# Patient Record
Sex: Female | Born: 1955 | Race: White | Hispanic: No | Marital: Married | Smoking: Never smoker
Health system: Southern US, Community
[De-identification: ages and names within clinical notes are randomized; demographics above are authoritative.]

## PROBLEM LIST (undated history)

## (undated) DIAGNOSIS — R55 Syncope and collapse: Secondary | ICD-10-CM

## (undated) DIAGNOSIS — G43909 Migraine, unspecified, not intractable, without status migrainosus: Secondary | ICD-10-CM

## (undated) DIAGNOSIS — R112 Nausea with vomiting, unspecified: Secondary | ICD-10-CM

## (undated) DIAGNOSIS — F329 Major depressive disorder, single episode, unspecified: Secondary | ICD-10-CM

## (undated) DIAGNOSIS — C4490 Unspecified malignant neoplasm of skin, unspecified: Secondary | ICD-10-CM

## (undated) DIAGNOSIS — Z9889 Other specified postprocedural states: Secondary | ICD-10-CM

## (undated) DIAGNOSIS — F32A Depression, unspecified: Secondary | ICD-10-CM

## (undated) DIAGNOSIS — E785 Hyperlipidemia, unspecified: Secondary | ICD-10-CM

## (undated) DIAGNOSIS — Z780 Asymptomatic menopausal state: Secondary | ICD-10-CM

## (undated) HISTORY — PX: APPENDECTOMY: SHX54

## (undated) HISTORY — DX: Hyperlipidemia, unspecified: E78.5

## (undated) HISTORY — DX: Depression, unspecified: F32.A

## (undated) HISTORY — DX: Asymptomatic menopausal state: Z78.0

## (undated) HISTORY — DX: Syncope and collapse: R55

## (undated) HISTORY — DX: Major depressive disorder, single episode, unspecified: F32.9

---

## 2005-12-01 ENCOUNTER — Emergency Department (HOSPITAL_COMMUNITY): Admission: EM | Admit: 2005-12-01 | Discharge: 2005-12-01 | Payer: Self-pay | Admitting: Emergency Medicine

## 2006-01-26 ENCOUNTER — Emergency Department (HOSPITAL_COMMUNITY): Admission: EM | Admit: 2006-01-26 | Discharge: 2006-01-26 | Payer: Self-pay | Admitting: Emergency Medicine

## 2007-06-09 ENCOUNTER — Emergency Department (HOSPITAL_COMMUNITY): Admission: EM | Admit: 2007-06-09 | Discharge: 2007-06-09 | Payer: Self-pay | Admitting: Emergency Medicine

## 2008-01-23 ENCOUNTER — Encounter: Payer: Self-pay | Admitting: Gastroenterology

## 2008-02-09 ENCOUNTER — Ambulatory Visit: Payer: Self-pay | Admitting: Gastroenterology

## 2008-02-24 ENCOUNTER — Ambulatory Visit: Payer: Self-pay | Admitting: Internal Medicine

## 2010-07-10 DIAGNOSIS — R55 Syncope and collapse: Secondary | ICD-10-CM

## 2010-07-10 HISTORY — DX: Syncope and collapse: R55

## 2010-07-14 ENCOUNTER — Emergency Department (HOSPITAL_COMMUNITY): Payer: BC Managed Care – PPO

## 2010-07-14 ENCOUNTER — Observation Stay (HOSPITAL_COMMUNITY)
Admission: EM | Admit: 2010-07-14 | Discharge: 2010-07-16 | Disposition: A | Discharge status: Home / Self Care | Payer: BC Managed Care – PPO | Attending: Internal Medicine | Admitting: Internal Medicine

## 2010-07-14 DIAGNOSIS — Z01818 Encounter for other preprocedural examination: Secondary | ICD-10-CM | POA: Insufficient documentation

## 2010-07-14 DIAGNOSIS — R55 Syncope and collapse: Secondary | ICD-10-CM | POA: Insufficient documentation

## 2010-07-14 DIAGNOSIS — Z01810 Encounter for preprocedural cardiovascular examination: Secondary | ICD-10-CM | POA: Insufficient documentation

## 2010-07-14 DIAGNOSIS — F329 Major depressive disorder, single episode, unspecified: Secondary | ICD-10-CM | POA: Insufficient documentation

## 2010-07-14 DIAGNOSIS — E785 Hyperlipidemia, unspecified: Secondary | ICD-10-CM | POA: Insufficient documentation

## 2010-07-14 DIAGNOSIS — Z01812 Encounter for preprocedural laboratory examination: Secondary | ICD-10-CM | POA: Insufficient documentation

## 2010-07-14 DIAGNOSIS — R079 Chest pain, unspecified: Secondary | ICD-10-CM

## 2010-07-14 DIAGNOSIS — Z8249 Family history of ischemic heart disease and other diseases of the circulatory system: Secondary | ICD-10-CM | POA: Insufficient documentation

## 2010-07-14 DIAGNOSIS — F3289 Other specified depressive episodes: Secondary | ICD-10-CM | POA: Insufficient documentation

## 2010-07-14 DIAGNOSIS — R51 Headache: Secondary | ICD-10-CM | POA: Insufficient documentation

## 2010-07-14 LAB — DIFFERENTIAL
Basophils Relative: 0 % (ref 0–1)
Eosinophils Relative: 12 % — ABNORMAL HIGH (ref 0–5)
Lymphocytes Relative: 21 % (ref 12–46)
Lymphs Abs: 1.3 10*3/uL (ref 0.7–4.0)
Monocytes Relative: 7 % (ref 3–12)
Neutro Abs: 3.7 10*3/uL (ref 1.7–7.7)
Neutrophils Relative %: 59 % (ref 43–77)

## 2010-07-14 LAB — POCT CARDIAC MARKERS: Myoglobin, poc: 65 ng/mL (ref 12–200)

## 2010-07-14 LAB — COMPREHENSIVE METABOLIC PANEL
ALT: 21 U/L (ref 0–35)
Albumin: 3.6 g/dL (ref 3.5–5.2)
Alkaline Phosphatase: 46 U/L (ref 39–117)
BUN: 12 mg/dL (ref 6–23)
CO2: 28 mEq/L (ref 19–32)
Chloride: 104 mEq/L (ref 96–112)
Creatinine, Ser: 0.73 mg/dL (ref 0.4–1.2)
GFR calc Af Amer: >60 mL/min (ref >60)
GFR calc non Af Amer: >60 mL/min (ref >60)
Glucose, Bld: 86 mg/dL (ref 70–99)
Sodium: 139 mEq/L (ref 135–145)
Total Protein: 6.6 g/dL (ref 6.0–8.3)

## 2010-07-14 LAB — CBC
HCT: 38.1 % (ref 36.0–46.0)
MCHC: 34.1 g/dL (ref 30.0–36.0)
Platelets: 206 10*3/uL (ref 150–400)
RBC: 3.96 MIL/uL (ref 3.87–5.11)

## 2010-07-14 MED ORDER — IOHEXOL 350 MG/ML SOLN
100.0000 mL | Freq: Once | INTRAVENOUS | Status: AC | PRN
  Administered 2010-07-14: 100 mL via INTRAVENOUS

## 2010-07-15 DIAGNOSIS — R55 Syncope and collapse: Secondary | ICD-10-CM

## 2010-07-15 LAB — CARDIAC PANEL(CRET KIN+CKTOT+MB+TROPI)
CK, MB: 1.5 ng/mL (ref 0.3–4.0)
CK, MB: 1.7 ng/mL (ref 0.3–4.0)
Relative Index: INVALID (ref 0.0–2.5)
Relative Index: INVALID (ref 0.0–2.5)
Relative Index: INVALID (ref 0.0–2.5)
Total CK: 89 U/L (ref 7–177)
Troponin I: <0.01 ng/mL (ref 0.00–0.06)
Troponin I: 0.01 ng/mL (ref 0.00–0.06)

## 2010-07-15 LAB — BASIC METABOLIC PANEL
CO2: 28 mEq/L (ref 19–32)
Creatinine, Ser: 0.75 mg/dL (ref 0.4–1.2)
Glucose, Bld: 85 mg/dL (ref 70–99)
Sodium: 141 mEq/L (ref 135–145)

## 2010-07-15 LAB — HEMOGLOBIN A1C: Mean Plasma Glucose: 100 mg/dL (ref <117)

## 2010-07-15 LAB — LIPID PANEL
Cholesterol: 231 mg/dL — ABNORMAL HIGH (ref 0–200)
LDL Cholesterol: 153 mg/dL — ABNORMAL HIGH (ref 0–99)
Total CHOL/HDL Ratio: 3.8 RATIO
VLDL: 17 mg/dL (ref 0–40)

## 2010-07-15 LAB — CBC
HCT: 35.9 % — ABNORMAL LOW (ref 36.0–46.0)
MCV: 95.7 fL (ref 78.0–100.0)
RDW: 12.2 % (ref 11.5–15.5)

## 2010-07-16 ENCOUNTER — Emergency Department (HOSPITAL_BASED_OUTPATIENT_CLINIC_OR_DEPARTMENT_OTHER)
Admission: EM | Admit: 2010-07-16 | Discharge: 2010-07-17 | Disposition: A | Discharge status: Home / Self Care | Payer: BC Managed Care – PPO | Attending: Emergency Medicine | Admitting: Emergency Medicine

## 2010-07-16 DIAGNOSIS — R072 Precordial pain: Secondary | ICD-10-CM

## 2010-07-16 DIAGNOSIS — W292XXA Contact with other powered household machinery, initial encounter: Secondary | ICD-10-CM | POA: Insufficient documentation

## 2010-07-16 DIAGNOSIS — S01501A Unspecified open wound of lip, initial encounter: Secondary | ICD-10-CM | POA: Insufficient documentation

## 2010-07-16 DIAGNOSIS — R079 Chest pain, unspecified: Secondary | ICD-10-CM

## 2010-07-16 LAB — CBC
Hemoglobin: 11.2 g/dL — ABNORMAL LOW (ref 12.0–15.0)
MCH: 33 pg (ref 26.0–34.0)
Platelets: 175 10*3/uL (ref 150–400)
RBC: 3.39 MIL/uL — ABNORMAL LOW (ref 3.87–5.11)
WBC: 5.4 10*3/uL (ref 4.0–10.5)

## 2010-07-16 LAB — BASIC METABOLIC PANEL
BUN: 13 mg/dL (ref 6–23)
CO2: 25 mEq/L (ref 19–32)
Calcium: 7.7 mg/dL — ABNORMAL LOW (ref 8.4–10.5)
GFR calc non Af Amer: >60 mL/min (ref >60)
Glucose, Bld: 83 mg/dL (ref 70–99)

## 2010-07-16 LAB — VITAMIN B12: Vitamin B-12: 361 pg/mL (ref 211–911)

## 2010-07-18 NOTE — Consult Note (Signed)
NAME:  Kelli Richmond, Kelli Richmond              ACCOUNT NO.:  [id number]  MEDICAL RECORD NO.:  [id number]           PATIENT TYPE:  I  LOCATION:  3702                         FACILITY:  [facility]  PHYSICIAN:  Wendi Snipes, MD DATE OF BIRTH:  Sep 13, 1955  DATE OF CONSULTATION:  07/14/2010 DATE OF DISCHARGE:                                CONSULTATION   PRIMARY CARE PHYSICIAN:  Dr. Collins Scotland in [place].  REASON FOR CONSULTATION:  Syncope and  chest pain.  CHIEF COMPLAINT:  Syncope and chest pain.  HISTORY OF PRESENT ILLNESS:  This is a 55 year old white female with a history of depression and a family history of premature coronary artery disease who presents here after passing out while at the physician's office today.  She felt chest pressure prior to losing consciousness for a few seconds.  She denies any palpitations surrounding this event, and states that she has not passed out before and there was no prodrome to her syncopal episode besides chest pressure.  She also endorses chest pressure during the earlier parts of the week at rest and with mild exertion.  She has not received cardiac workup in the past, additionally she complains of episodes of chest pressure while in the hospital since her admission.  She otherwise denies any increased lower extremity edema or paroxysmal nocturnal dyspnea, orthopnea or palpitations.  PAST MEDICAL HISTORY:  Depression.  ALLERGIES:  No known drug allergies.  MEDICATIONS ON ADMISSION:  Cymbalta 90 mg daily, estrogen replacement, Caltrate, and multivitamin.  SOCIAL HISTORY:  She lives in [place] with her husband.  She is unemployed.  She does not smoke.  FAMILY HISTORY:  Her father died of myocardial infarction at 65.  She has twin sister that suffered from myocardial function last year.  REVIEW OF SYSTEMS:  All 14 systems were reviewed were negative except as mentioned detail in HPI.  PHYSICAL EXAMINATION:  VITAL SIGNS:  Blood pressure is  111/71, respiratory rate is 16, pulse 82.  She is satting 96% on room air. GENERAL:  She is a 55 year old white female appearing her stated age in no acute distress. HEENT:  Moist mucous membranes.  Pupils are equal, round, reactive to light and accommodation.  Anicteric sclerae. NECK:  No jugular venous distention.  No thyromegaly. CARDIOVASCULAR:  Regular rate and rhythm.  No murmurs, rubs, or gallops. LUNGS:  Clear to auscultation bilaterally. ABDOMEN:  Nontender and nondistended.  Positive bowel sounds.  No masses. EXTREMITIES:  No clubbing, cyanosis, or edema. NEUROLOGIC:  Alert and x3.  Cranial nerves II through XII grossly intact.  No focal neurologic deficit. SKIN:  Warm, dry, and no rashes. PSYCHIATRIC:  Mood and affect are appropriate.  RADIOLOGY:  Review a CT of her chest, abdomen, and pelvis revealed no acute process.  EKG showed normal sinus rhythm with a rate of 78 beats per minute with no ST or T-wave abnormalities suggestive of ischemia, normal EKG.  Laboratory review white count 6.3, hematocrit is 38, potassium is 4.1, creatinine is 0.73.  INR is 0.9.  Troponin is 0.05.  ASSESSMENT/PLAN:  Assessment is a 54 year old white female with a family history of premature coronary  artery disease here with chest pressure with a brief episode of syncope. 1. Chest pressure is concerning for unstable angina.  However, there     is no current objective evidence of ischemia.  We will recommend     full-dose anticoagulation, cycle cardiac markers and there is no     further evidence of ischemia.  We will suggest noninvasive risk     ratification with an exercise stress test. 2. Syncope is likely vasovagal.  We will monitor telemetry for any     arrhythmias and consider obtaining echocardiogram for structural     heart disease and in the morning.     Wendi Snipes, MD     BHH/MEDQ  D:  07/14/2010  T:  07/15/2010  Job:  [id number]  Electronically Signed by Jim Desanctis MD on 07/18/2010 12:54:54 PM

## 2010-07-18 NOTE — Discharge Summary (Signed)
NAME:  Kelli Richmond, Kelli Richmond              ACCOUNT NO.:  [id number]  MEDICAL RECORD NO.:  [id number]           PATIENT TYPE:  I  LOCATION:  3702                         FACILITY:  [facility]  PHYSICIAN:  Andreas Blower, MD       DATE OF BIRTH:  [birth year]  DATE OF ADMISSION:  07/14/2010 DATE OF DISCHARGE:  07/16/2010                              DISCHARGE SUMMARY   PRIMARY CARE PHYSICIAN:  Dr. Collins Scotland.  CARDIOLOGIST:  [facility].  DISCHARGE DIAGNOSES: 1. Chest pain. 2. Syncope, most likely vasovagal. 3. Status post menopause. 4. Depression. 5. Hyperlipidemia.  DISCHARGE MEDICATIONS: 1. Simvastatin 20 mg daily at bedtime. 2. Aspirin 81 mg p.o. daily. 3. Calcium carbonate 600 mg twice daily. 4. Cymbalta 90 mg p.o. daily. 5. Estradiol 0.5 mg p.o. twice daily. 6. Multivitamins 1 tablet p.o. daily.  BRIEF ADMITTING HISTORY AND PHYSICAL:  Kelli Richmond is a 55 year old Caucasian female with a history of depression who presented on July 14, 2010, with chest pain and syncope.  RADIOLOGY/IMAGING: 1. The patient had portable chest x-ray which shows no acute     cardiopulmonary process. 2. The patient had a head CT without contrast which shows no acute or     significant findings. 3. The patient had CT of the chest, abdomen, and pelvis which shows no     pulmonary embolism, no evidence of thoracic aortic dissection.  No     acute findings within the abdomen or pelvis.  LABORATORY DATA:  CBC shows a white count of 5.4, hemoglobin 11.2, hematocrit 33.1, platelet count 175, D-dimer 0.42.  Electrolytes normal with a creatinine of 0.73.  Liver function tests normal.  Troponins negative x3.  LDL was 153.  TSH was 2.938.  Vitamin B12 is 361.  Serum cortisol is 8.3.  Hemoglobin A1c is 5.1.  PROCEDURES DONE:  The patient had cardiac cath on July 16, 2010, preliminary result showed normal LV function.  No significant obstruction.  PENDING TESTS:  The patient has 2D echocardiogram pending, to  be discharged after the echocardiogram is done.  HOSPITAL COURSE BY PROBLEM: 1. Chest pain.  The patient was admitted and was ruled out for acute     coronary syndrome.  Given the patient's risk factor, Cardiology was     consulted and Cardiology elected to a cardiac cath on July 16, 2010, preliminary result showed normal LV function.  No significant     obstruction. 2. Syncope.  The patient had a head CT, CT angiogram, and a D-dimer     done which were all normal.  No PE was noted and D-dimer was normal     to indicate a low likelihood of a thromboembolic event.  A 2D     echocardiogram is pending.  The patient was instructed that if she     is not called with the results, she is to have her primary care     physician follow up with results. 3. Depression.  Continued the patient on home medications. 4. Hyperlipidemia.  LDL was elevated at 153.  As a result, was started     on low-dose  statin which she will continue at the time of     discharge. 5. Postmenopausal.  Continue the patient on home medications.  DISPOSITION AND FOLLOWUP:  The patient is to follow up with her primary care physician in 1 week.   Addendum: 2D ECHO Study Conclusions   - Left ventricle: The cavity size was normal. Wall thickness was     normal. The estimated ejection fraction was 60%. Wall motion was     normal; there were no regional wall motion abnormalities. Patient was called and notified of the results.  Time spent on discharge talking to the patient, family, and coordinating care was 35 minutes.   Andreas Blower, MD   SR/MEDQ  D:  07/16/2010  T:  07/17/2010  Job:  [id number]  Electronically Signed by Wardell Heath Seann Genther  on 07/17/2010 09:30:10 PM

## 2010-07-22 NOTE — H&P (Signed)
NAME:  Kelli Richmond, Kelli Richmond              ACCOUNT NO.:  [id number]  MEDICAL RECORD NO.:  [id number]           PATIENT TYPE:  I  LOCATION:  3702                         FACILITY:  [facility]  PHYSICIAN:  Erick Blinks, MD     DATE OF BIRTH:  [birth year]  DATE OF ADMISSION:  07/14/2010 DATE OF DISCHARGE:                             HISTORY & PHYSICAL   PRIMARY CARE PHYSICIAN:  Tammy R. Collins Scotland, MD.  CHIEF COMPLAINT:  Chest pain and syncope.  HISTORY OF PRESENT ILLNESS:  This is a 55 year old female that was accompanying her daughter to her daughter's doctor's appointment today when she had a syncopal episode.  The patient reports feeling dizzy prior to passing out.  She also complained of some retrosternal chest pain prior to passing out as well.  She passed out for approximately few seconds without any head trauma. After the episode, she also did complain of some retrosternal chest pain which was radiating to her left arm.  The patient has been having these episodes on and off for the past week or so.  She reports that she is normally active person and works on the gym 3 times a day.  The past week she did not go to gym secondary to she was not feeling well.  She reports that when these pains come on, she often starts to have diaphoresis.  Denies any nausea or vomiting. She describes the pain as a pressure-type pain, episode lasting a few minutes.  Episodes occur at rest as well as on exertion.  When they do occur on exertion, they resolve.  When she rests, these usually resolves spontaneously.  Workup in the emergency room was unrevealing but the patient has been admitted for further evaluation.  PAST MEDICAL HISTORY:  The patient does not have any significant past medical history.  ALLERGIES:  No known drug allergies.  MEDICATIONS PRIOR TO ADMISSION: 1. Cymbalta 90 mg p.o. daily. 2. Aspirin 81 mg daily. 3. Calcium carbonate 600 mg p.o. b.i.d. 4. Multivitamin 1 tablet p.o. daily. 5.  Estrace 0.5 mg p.o. twice daily for menopause. 6. The patient reports she is taking Cymbalta also for menopause.  She     denies any history of depression.  FAMILY HISTORY:  The patient has a twin sister who had an MI history with stents placed.  She has a brother who underwent stress testing today for chest pain.  Her father passed away from an MI in his early 87s and her mother also has significant cardiac history with a pacemaker placed.  SOCIAL HISTORY:  The patient does not smoke, drink, or use any drugs. She is a relatively active person and reports working out approximately 3 times a week before she started having these pains.  REVIEW OF SYSTEMS:  All systems have been reviewed and pertinent positives stated in the HPI.  PHYSICAL EXAMINATION:  VITAL SIGNS:  Temperature 97.3, blood pressure 111/71, heart rate of 82, respirations 14, pulse ox 96% on room air. GENERAL:  The patient is in no acute distress, lying comfortably in bed. HEENT:  Normocephalic, atraumatic.  Pupils are equal, round, and reactive to light.NECK:  Supple. CHEST:  Clear to auscultation bilaterally. CARDIAC:  Shows S1, S2 with a regular rate and rhythm.  There is no palpable chest pain. ABDOMEN:  Soft, nontender.  Bowel sounds are active. EXTREMITIES:  Show no signs of cyanosis, clubbing, or edema. NEUROLOGIC:  The patient has 5/5 strength bilaterally.  There is no facial asymmetry.  Cranial nerves II-XII are grossly intact.  The patient is alert and oriented x3. SKIN:  Warm without any visible lesions.  LABORATORY DATA:  WBC 6.3, hemoglobin 13, platelet count of 206,000. INR 0.94.  Sodium 139, potassium 4.1, chloride 104, bicarb 28, glucose 86, BUN 12, creatinine 0.73.  Liver function tests within normal limits. Calcium 9.1.  Point-of-care cardiac markers are negative.  EKG shows a right bundle branch block but otherwise no acute ST-T changes.  Chest x-ray shows no acute cardiopulmonary process.  CT  head shows no acute or significant findings.  CT angio of the chest, abdomen, and pelvis show no evidence of thoracic aortic dissection.  No saddle embolus identified.  No acute findings within the abdomen or pelvis.  No evidence for aortic dissection.  ASSESSMENT/PLAN: 1. Chest pain with syncope.  Although the patient does not have many     medical problems that would be risk factors for coronary disease,     she does have a very significant family history of premature     coronary disease.  At this time, we will admit her to telemetry     bed, cycle her cardiac enzymes, repeat an EKG in the morning and     check a 2-D echocardiogram. We have asked cardiology consultation     with [facility] as she will likely need a stress test and perhaps more     of as felt by the cardiology service.  We will keep her n.p.o.     after midnight for any possible testing in the morning.  We will     continue her on aspirin for now.  To further risk stratify her, we will check a     fasting lipid panel as well as the hemoglobin A1c.  We will also     check TSH.  We will continue her home medication. 2. Headache.  The patient does have right-sided headache.  She does     have a history of migraines and describes as classically as her     migraine.  We have given her Toradol and can treat with further     medication accordingly.  I do not feel that an MRI of her brain is     necessary at this time as she has no focal deficits.  Her CT of     head has been negative.  Further orders per the clinical course.     Erick Blinks, MD     JM/MEDQ  D:  07/14/2010  T:  07/15/2010  Job:  [id number]  cc:   Tammy R. Collins Scotland, M.D.  Electronically Signed by Erick Blinks  on 07/22/2010 09:57:48 PM

## 2010-08-21 NOTE — Procedures (Signed)
  NAME:  Kelli Richmond, Kelli Richmond              ACCOUNT NO.:  [id number]  MEDICAL RECORD NO.:  [id number]           PATIENT TYPE:  E  LOCATION:  [facility]                         [facility]  PHYSICIAN:  Arturo Morton. Riley Kill, MD, FACCDATE OF BIRTH:  1955-07-06  DATE OF PROCEDURE:  07/16/2010 DATE OF DISCHARGE:  07/17/2010                           CARDIAC CATHETERIZATION   INDICATIONS:  [name] is a 55 year old admitted with a history of syncope.  She was seen in consultation by the [facility] Cardiology Team, and referred for diagnostic cardiac catheterization.  Risks, benefits, and alternatives were explained to the patient and she consented to proceed.  PROCEDURES: 1. Left heart catheterization. 2. Selective coronary arteriography. 3. Selective left ventriculography.  DESCRIPTION OF PROCEDURE:  The procedure was performed from the femoral artery using 4-French catheters.  She tolerated the procedure well without complications and was taken to the holding area in satisfactory clinical condition.  HEMODYNAMIC DATA: 1. Central aortic pressure was 135/82, mean 106. 2. LV pressure 128/13. 3. No gradient or pullback across aortic valve.  ANGIOGRAPHIC DATA: 1. Ventriculography done in the RAO projection revealed well-preserved     left ventricular function.  No wall motion abnormalities were     identified. 2. The left main is short and without significant focal obstruction.     It trifurcates early into a left anterior descending, ramus     intermedius, and native circumflex vessel. 3. The left anterior descending artery courses to the apex.  There are     several diagonal branches, all of which are relatively small.  The     apical vessel bifurcates.  No significant obstruction is noted. 4. The ramus intermedius is relatively small in caliber, bifurcates     distally, and is without significant obstruction. 5. The circumflex is a moderate-sized vessel providing a large     marginal branch  and AV circumflex.  This does not demonstrate     significant focal obstruction. 6. The right coronary artery provides a posterior descending and     posterolateral branch and is normally distributed.  The vessel was     smooth without significant focal obstruction.  CONCLUSION: 1. Well-preserved left ventricular function. 2. No significant coronary obstruction.  DISPOSITION: 1. The patient will be taken back to her room for convalescent care. 2. 2-D echo. 3. D-dimer.     Arturo Morton. Riley Kill, MD, [facility]     TDS/MEDQ  D:  08/11/2010  T:  08/11/2010  Job:  [id number]  cc:   Vesta Mixer, M.D. [facility] Tammy R. Collins Scotland, M.D.  Electronically Signed by Shawnie Pons MD [facility] on 08/21/2010 05:37:18 AM

## 2010-09-23 ENCOUNTER — Encounter: Payer: Self-pay | Admitting: *Deleted

## 2010-09-23 ENCOUNTER — Encounter: Payer: Self-pay | Admitting: Physician Assistant

## 2010-09-24 ENCOUNTER — Encounter: Payer: Self-pay | Admitting: Physician Assistant

## 2010-09-24 ENCOUNTER — Encounter (INDEPENDENT_AMBULATORY_CARE_PROVIDER_SITE_OTHER): Payer: BC Managed Care – PPO

## 2010-09-24 ENCOUNTER — Ambulatory Visit (INDEPENDENT_AMBULATORY_CARE_PROVIDER_SITE_OTHER): Payer: BC Managed Care – PPO | Admitting: Physician Assistant

## 2010-09-24 VITALS — BP 116/78 | HR 95 | Ht 63.0 in | Wt 137.0 lb

## 2010-09-24 DIAGNOSIS — R55 Syncope and collapse: Secondary | ICD-10-CM

## 2010-09-24 DIAGNOSIS — R9431 Abnormal electrocardiogram [ECG] [EKG]: Secondary | ICD-10-CM

## 2010-09-24 NOTE — Patient Instructions (Signed)
Your physician recommends that you schedule a follow-up appointment in: 4 WEEKS WITH DR. Graciela Husbands AS PER DR. KLEIN AND SCOTT WEAVER, PA-C FOR SYNCOPE [phone]  Your physician has recommended that you wear an event monitor THIS NEEDS TO BE PUT ON ASAP AS PER SCOTT WEAVER, PA-C FOR SYNCOPE 780.2. Event monitors are medical devices that record the heart's electrical activity. Doctors most often [place] these monitors to diagnose arrhythmias. Arrhythmias are problems with the speed or rhythm of the heartbeat. The monitor is a small, portable device. You can wear one while you do your normal daily activities. This is usually used to diagnose what is causing palpitations/syncope (passing out).   SCOTT WEAVER, PA-C HAS ASKED FOR YOU NOT TO DRIVE UNTIL FURTHER EVALUATION.

## 2010-09-24 NOTE — Progress Notes (Signed)
History of Present Illness: Kelli Richmond is a 55 y.o. female who was evaluated by our service when [facility] with syncope.  Myocardial infarction was ruled out.  She has a significant family history of CAD and was referred for cardiac catheterization.  This was performed on 3/7 and demonstrated no CAD.  Echocardiogram demonstrated normal LV function with an EF of 60%. Carotid dopplers were negative for ICA stenosis.  Head CT was negative. She presents for followup.  Since discharge from the hospital, she's had 2 other episodes of syncope.  These generally come on without warning.  She may feel somewhat lightheaded prior to this.  She thinks that she may feel rapid palpitations.  She has noted some episodes where she feels rapid palpitations and lightheadedness but has not passed out.  She denies any confusion after passing out.  She denies any tongue biting, loss of bowel or bladder function.  Her husband has witnessed a couple of episodes and says that she is out for about 10-15 seconds.  No CPR has been started.  She does not appear to shake or have involuntary movements with these.  No tongue biting or loss of bowel or bladder function.  She denies chest pain or shortness of breath.  She is usually fairly active.  Over the last couple of months, however, she has felt fatigued.  Her husband notes that she does not snore and there are no witnessed apnea episodes.  She had recent labs with her PCP that included a complete blood count and thyroid-stimulating hormone.  Past Medical History  Diagnosis Date  . Chest pain     a. cath 3/12: no CAD; b. echo 3/12: EF 60%  . Syncope 3/12    probably vasovagal  . Depression   . Hyperlipidemia   . Postmenopausal     Current Outpatient Prescriptions  Medication Sig Dispense Refill  . aspirin 81 MG tablet Take 81 mg by mouth daily.        . Calcium Carbonate-Vitamin D (CALCIUM 600+D) 600-400 MG-UNIT per tablet Take 1  tablet by mouth 2 (two) times daily.        . DULoxetine (CYMBALTA) 60 MG capsule Take 60 mg by mouth daily.        Marland Kitchen estradiol (ESTRACE) 0.5 MG tablet Take 0.5 mg by mouth 2 (two) times daily.        . Multiple Vitamin (MULTIVITAMIN) capsule Take 1 capsule by mouth daily.        . simvastatin (ZOCOR) 20 MG tablet Take 20 mg by mouth at bedtime.          Allergies: No Known Allergies   History  Substance Use Topics  . Smoking status: Never Smoker   . Smokeless tobacco: Never Used  . Alcohol Use: No    Family History  Problem Relation Age of Onset  . Heart attack Father     CABG  . Bradycardia Mother     has a pacemaker  . Heart attack Sister 52    twin sister, MI treated with PCI  Of note, there is no family history of sudden cardiac death.  ROS:  See history of present illness.  She denies fevers, chills, cough, melena, hematochezia, dysphagia, odynophagia, weight changes, skin changes, rashes, arthralgias.  All other systems reviewed and negative.  Vital Signs: BP 116/78  Pulse 95  Ht 5\' 3"  (1.6 m)  Wt 137 lb (62.143 kg)  BMI 24.27 kg/m2  PHYSICAL  EXAM: Well nourished, well developed, in no acute distress HEENT: normal Neck: no JVD Endocrine: no thyromegaly Vascular: No carotid bruits Cardiac:  normal S1, S2; RRR; no murmur Lungs:  clear to auscultation bilaterally, no wheezing, rhonchi or rales Abd: soft, nontender, no hepatomegaly Ext: no edema Skin: warm and dry Neuro:  CNs 2-12 intact, no focal abnormalities noted Psychiatric: Normal affect  EKG:  Normal sinus rhythm, heart rate 89, normal axis RSR prime in V1, no ischemic changes  ASSESSMENT AND PLAN:

## 2010-09-24 NOTE — Assessment & Plan Note (Addendum)
She has an RSR prime in V1 and V2 on her electrocardiogram.  I reviewed this with Dr. Graciela Husbands.  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  No delta waves are present.  Her symptoms are somewhat concerning for arrhythmogenic syncope.  She has normal LVF and no ischemic heart disease.  We could consider a Flecainide challenge to r/o ventricular arrhythmias.  For now, we will place her on an event monitor.  She has been advised to do no driving.  Her mother sees Dr. Graciela Husbands.  She will be brought back in close follow up in 4 weeks with him.

## 2010-10-09 ENCOUNTER — Encounter: Payer: Self-pay | Admitting: Physician Assistant

## 2010-10-15 ENCOUNTER — Telehealth: Payer: Self-pay | Admitting: Internal Medicine

## 2010-10-15 NOTE — Telephone Encounter (Signed)
Wife is wearing a monitor and has passed out 5 times since she has been wearing the monitor.  This is a 30 day event.  She feels really bad today and he is concerned that she needs to be seen sooner.  His cell [phone] or work [phone].

## 2010-10-15 NOTE — Telephone Encounter (Signed)
Left message for pt husband to return call, if pt has problems over night to go to [place] for eval Deliah Goody

## 2010-10-22 ENCOUNTER — Encounter: Payer: Self-pay | Admitting: Internal Medicine

## 2010-10-22 ENCOUNTER — Ambulatory Visit (INDEPENDENT_AMBULATORY_CARE_PROVIDER_SITE_OTHER): Payer: BC Managed Care – PPO | Admitting: Internal Medicine

## 2010-10-22 DIAGNOSIS — F32A Depression, unspecified: Secondary | ICD-10-CM | POA: Insufficient documentation

## 2010-10-22 DIAGNOSIS — F329 Major depressive disorder, single episode, unspecified: Secondary | ICD-10-CM

## 2010-10-22 DIAGNOSIS — R9431 Abnormal electrocardiogram [ECG] [EKG]: Secondary | ICD-10-CM

## 2010-10-22 DIAGNOSIS — R55 Syncope and collapse: Secondary | ICD-10-CM

## 2010-10-22 NOTE — Assessment & Plan Note (Signed)
All this was initially concerning for Brugada; the fact that her syncope is non-arrhythmic is exceedingly reassuring

## 2010-10-22 NOTE — Progress Notes (Signed)
HPI: Kelli Richmond is a 55 y.o. female Seen for evaluation of recurrent syncope. She had 2 falls one 18 months ago with her grandchild and one about a year ago. She does not recall why she fell. Since around that time she's had problems with progressive degrees of fatigue and dizziness. The dizziness is not positional.  In March she was admitted to hospital with syncope. Myocardial infarction was ruled out.  She has a significant family history of CAD and was referred for cardiac catheterization.  This was performed on 3/7 and demonstrated no CAD.  Echocardiogram demonstrated normal LV function with an EF of 60%. Carotid dopplers were negative for ICA stenosis.  Head CT was negative.   She saw Tereso Newcomer in May. She was given an event recorder. She has had repeated episodes of syncope while wearing the event recorder. She's had multiple episodes of dizziness or near recorder. These have all been associated with sinus rhythm at rates between 80 and 110 beats per minute There is frequently no prodrome. She does have recovery symptoms are quite stereotypical that include clamminess recovery residual orthostatic intolerance interestingly she has not been described as being pale.  She also has a history of orthostatic palpitations as well as with exercise and showers. She is past her menses. Around that time she was started on antidepressant therapy. She complains primarily of "not feeling well". This is relatively nonspecific although on more direct questioning it seems as related to the aforementioned complaints. She denies a history of syncope as a child. 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 Her husband at the very end of the interview asked to stress could be contributing.I said yes  Current Outpatient Prescriptions  Medication Sig Dispense Refill  . aspirin 81 MG tablet Take 81 mg by mouth daily.        . Calcium Carbonate-Vitamin D (CALCIUM 600+D) 600-400 MG-UNIT per tablet Take 1 tablet by mouth  2 (two) times daily.        . DULoxetine (CYMBALTA) 60 MG capsule Take 60 mg by mouth daily.        Marland Kitchen estradiol (ESTRACE) 0.5 MG tablet Take 0.5 mg by mouth 2 (two) times daily.        . Multiple Vitamin (MULTIVITAMIN) capsule Take 1 capsule by mouth daily.        . simvastatin (ZOCOR) 20 MG tablet Take 20 mg by mouth at bedtime.          No Known Allergies  Past Medical History  Diagnosis Date  . Chest pain     a. cath 3/12: no CAD; b. echo 3/12: EF 60%  . Syncope 3/12    probably vasovagal  . Depression   . Hyperlipidemia   . Postmenopausal     Past Surgical History  Procedure Date  . Appendectomy   . Cesarean section     Family History  Problem Relation Age of Onset  . Heart attack Father     CABG  . Bradycardia Mother     has a pacemaker  . Heart attack Sister 40    twin sister, MI treated with PCI    History   Social History  . Marital Status: Married    Spouse Name: N/A    Number of Children: N/A  . Years of Education: N/A   Occupational History  . Not on file.   Social History Main Topics  . Smoking status: Never Smoker   . Smokeless tobacco: Never Used  . Alcohol  Use: No  . Drug Use: No  . Sexually Active: Not on file   Other Topics Concern  . Not on file   Social History Narrative    The patient does not smoke, drink, or use any drugs.  She is a relatively active person and reports working out approximately  3 times a week before she started having these pains.     Fourteen point review of systems was negative except as noted in HPI and PMH   PHYSICAL EXAMINATION  Blood pressure 123/78, pulse 110, height 5\' 3"  (1.6 m), weight 138 lb 6.4 oz (62.778 kg).   Well developed and nourished middle-age Caucasian female appearing her stated age in no acute distress HENT normal Neck supple with JVP-flat Carotids brisk and full without bruits Back without scoliosis or kyphosis Clear Regular rate and rhythm, no murmurs or gallops Abd-soft with  active BS without hepatomegaly or midline pulsation Femoral pulses 2+ distal pulses intact No Clubbing cyanosis edema Skin-warm and dry LN-neg submandibular and supraclavicular A & Oriented CN 3-12 normal  Grossly normal sensory and motor function Affect flat and sad . Event recorder recorded 3 episodes of syncope and others of palpitations and dizziness all of which were associated with sinus rhythm.  Electrocardiogram from May demonstrated normal sinus rhythm with an R. Prime in lead V1 and V2

## 2010-10-22 NOTE — Assessment & Plan Note (Addendum)
The patient has recurrent syncope associated with normal rhythm. This suggests either a vasomotor phenomenon or psychiatric phenomenon. Given her problems with dizziness and her objective manifestations today of postural tachycardia this may represent a dysautonomia. However, the fact that she has persistent sitting and supine dizziness is not easily explained.  I have discussed the physiology of dysautonomia extensively with the family and the parts of her history in my mind are supportive of that diagnosis and those parts that are discordant. We have decided to pursue tilt table testing with an invasive arterial line in the hopes of trying to clarify whether vasomotor instability is the mechanism for her syncope. I should note that after having increased her salt following her hospitalization in March there is been no appreciable benefit

## 2010-10-22 NOTE — Assessment & Plan Note (Signed)
There is a sense of depression when talking to this woman. She describes it as secondary. However, he was taking antidepressants for a year or 2 before all this started. I spent a lot of time reviewing with the family the potential interactions between stress and depression and early mediated syndromes both as cause and as affect

## 2010-11-26 ENCOUNTER — Telehealth: Payer: Self-pay | Admitting: *Deleted

## 2010-11-26 NOTE — Telephone Encounter (Signed)
I left a message for the patient to call at her cell # [phone], so I can schedule her for a Tilt Table Test.

## 2010-12-16 ENCOUNTER — Encounter: Payer: Self-pay | Admitting: *Deleted

## 2010-12-16 NOTE — Telephone Encounter (Signed)
Letter mailed to the patient

## 2013-05-11 HISTORY — PX: AUGMENTATION MAMMAPLASTY: SUR837

## 2013-05-11 HISTORY — PX: REDUCTION MAMMAPLASTY: SUR839

## 2015-04-26 ENCOUNTER — Emergency Department (HOSPITAL_COMMUNITY)
Admission: EM | Admit: 2015-04-26 | Discharge: 2015-04-26 | Disposition: A | Discharge status: Home / Self Care | Payer: BLUE CROSS/BLUE SHIELD | Attending: Emergency Medicine | Admitting: Emergency Medicine

## 2015-04-26 ENCOUNTER — Encounter (HOSPITAL_COMMUNITY): Payer: Self-pay | Admitting: Emergency Medicine

## 2015-04-26 DIAGNOSIS — F329 Major depressive disorder, single episode, unspecified: Secondary | ICD-10-CM | POA: Diagnosis not present

## 2015-04-26 DIAGNOSIS — Z8639 Personal history of other endocrine, nutritional and metabolic disease: Secondary | ICD-10-CM | POA: Diagnosis not present

## 2015-04-26 DIAGNOSIS — R51 Headache: Secondary | ICD-10-CM | POA: Diagnosis present

## 2015-04-26 DIAGNOSIS — Z79899 Other long term (current) drug therapy: Secondary | ICD-10-CM | POA: Diagnosis not present

## 2015-04-26 DIAGNOSIS — R109 Unspecified abdominal pain: Secondary | ICD-10-CM | POA: Insufficient documentation

## 2015-04-26 DIAGNOSIS — G43809 Other migraine, not intractable, without status migrainosus: Secondary | ICD-10-CM

## 2015-04-26 HISTORY — DX: Migraine, unspecified, not intractable, without status migrainosus: G43.909

## 2015-04-26 MED ORDER — METOCLOPRAMIDE HCL 10 MG PO TABS: 10.0000 mg | ORAL_TABLET | Freq: Three times a day (TID) | ORAL | Status: DC | PRN

## 2015-04-26 MED ORDER — METOCLOPRAMIDE HCL 10 MG PO TABS
10.0000 mg | ORAL_TABLET | Freq: Once | ORAL | Status: AC
  Administered 2015-04-26: 10 mg via ORAL
  Filled 2015-04-26: qty 1

## 2015-04-26 MED ORDER — DIPHENHYDRAMINE HCL 25 MG PO CAPS
50.0000 mg | ORAL_CAPSULE | Freq: Once | ORAL | Status: AC
  Administered 2015-04-26: 50 mg via ORAL
  Filled 2015-04-26: qty 2

## 2015-04-26 MED ORDER — PROCHLORPERAZINE MALEATE 10 MG PO TABS
10.0000 mg | ORAL_TABLET | Freq: Once | ORAL | Status: DC
  Filled 2015-04-26: qty 1

## 2015-04-26 MED ORDER — PREDNISONE 20 MG PO TABS
60.0000 mg | ORAL_TABLET | Freq: Once | ORAL | Status: AC
  Administered 2015-04-26: 60 mg via ORAL
  Filled 2015-04-26: qty 3

## 2015-04-26 MED ORDER — ONDANSETRON 4 MG PO TBDP
4.0000 mg | ORAL_TABLET | Freq: Once | ORAL | Status: AC
  Administered 2015-04-26: 4 mg via ORAL
  Filled 2015-04-26: qty 1

## 2015-04-26 MED ORDER — IBUPROFEN 800 MG PO TABS
800.0000 mg | ORAL_TABLET | Freq: Once | ORAL | Status: AC
  Administered 2015-04-26: 800 mg via ORAL
  Filled 2015-04-26: qty 1

## 2015-04-26 NOTE — ED Notes (Signed)
Pt in room dry heaving. Dr Claudine Mouton notified and ordered zofran odt

## 2015-04-26 NOTE — ED Notes (Signed)
Pt states "I feel much better and I'm ready to go home"

## 2015-04-26 NOTE — Discharge Instructions (Signed)
Recurrent Migraine Headache Ms. Gargus, take reglan as needed for your headache.  See a primary care doctor within 3 days for close follow up.  If symptoms worsen, come back to the ED immediately.  Thank you. A migraine headache is very bad, throbbing pain on one or both sides of your head. Recurrent migraines keep coming back. Talk to your doctor about what things may bring on (trigger) your migraine headaches. HOME CARE  Only take medicines as told by your doctor.  Lie down in a dark, quiet room when you have a migraine.  Keep a journal to find out if certain things bring on migraine headaches. For example, write down:  What you eat and drink.  How much sleep you get.  Any change to your diet or medicines.  Lessen how much alcohol you drink.  Quit smoking if you smoke.  Get enough sleep.  Lessen any stress in your life.  Keep lights dim if bright lights bother you or make your migraines worse. GET HELP IF:  Medicine does not help your migraines.  Your pain keeps coming back.  You have a fever. GET HELP RIGHT AWAY IF:   Your migraine becomes really bad.  You have a stiff neck.  You have trouble seeing.  Your muscles are weak, or you lose muscle control.  You lose your balance or have trouble walking.  You feel like you will pass out (faint), or you pass out.  You have really bad symptoms that are different than your first symptoms. MAKE SURE YOU:   Understand these instructions.  Will watch your condition.  Will get help right away if you are not doing well or get worse.   This information is not intended to replace advice given to you by your health care provider. Make sure you discuss any questions you have with your health care provider.   Document Released: 02/04/2008 Document Revised: 05/02/2013 Document Reviewed: 01/02/2013 Elsevier Interactive Patient Education [employer].

## 2015-04-26 NOTE — ED Notes (Signed)
Pt. reports migraine headache with emesis onset this morning unrelieved by Exedrin tabs.

## 2015-04-26 NOTE — ED Notes (Signed)
Pt verbalized understanding of d/c instructions and has no further questions. Pt stable and NAD.  

## 2015-04-26 NOTE — ED Provider Notes (Signed)
CSN: [id number]   Arrival date & time 04/26/15 0112  History  By signing my name below, I, Kelli Richmond, attest that this documentation has been prepared under the direction and in the presence of Everlene Balls, MD. Electronically Signed: Altamease Richmond, ED Scribe. 04/26/2015. 2:41 AM.  Chief Complaint  Patient presents with  . Migraine    HPI The history is provided by the patient. No language interpreter was used.   Kelli Richmond is a 59 y.o. female with history of migraines who presents to the Emergency Department complaining of a constant, 10/10 in severity,  headache with onset yesterday morning upon waking. This headache feels similar to pain that she had with migraines in her 68s.  Excedrin migraine provided insufficient pain relief at home. BC powders usually improve her pain but she is out and used Excedrin instead.  Associated symptoms include nausea, vomiting, and abdominal pain (soreness that she associates with vomiting). Pt denies numbness, weakness, and diarrhea.   Past Medical History  Diagnosis Date  . Chest pain     a. cath 3/12: no CAD; b. echo 3/12: EF 60%  . Syncope 3/12    probably vasovagal  . Depression   . Hyperlipidemia   . Postmenopausal   . Migraine headache     Past Surgical History  Procedure Laterality Date  . Appendectomy    . Cesarean section      Family History  Problem Relation Age of Onset  . Heart attack Father     CABG  . Bradycardia Mother     has a pacemaker  . Heart attack Sister 72    twin sister, MI treated with PCI    Social History  Substance Use Topics  . Smoking status: Never Smoker   . Smokeless tobacco: Never Used  . Alcohol Use: No     Review of Systems 10 Systems reviewed and all are negative for acute change except as noted in the HPI. Home Medications   Prior to Admission medications   Medication Sig Start Date End Date Taking? Authorizing Provider  DULoxetine (CYMBALTA) 60 MG capsule Take 60 mg by mouth  daily.     Yes Historical Provider, MD  estradiol (VIVELLE-DOT) 0.05 MG/24HR patch Place 1 patch onto the skin 2 (two) times a week.   Yes Historical Provider, MD  Multiple Vitamin (MULTIVITAMIN) capsule Take 1 capsule by mouth daily.     Yes Historical Provider, MD    Allergies  Review of patient's allergies indicates no known allergies.  Triage Vitals: BP 139/86 mmHg  Pulse 85  Temp(Src) 97.9 F (36.6 C) (Oral)  Resp 16  Ht 5\' 3"  (1.6 m)  Wt 149 lb (67.586 kg)  BMI 26.40 kg/m2  SpO2 100%  Physical Exam  Constitutional: She is oriented to person, place, and time. She appears well-developed and well-nourished. No distress.  HENT:  Head: Normocephalic and atraumatic.  Nose: Nose normal.  Mouth/Throat: Oropharynx is clear and moist. No oropharyngeal exudate.  Eyes: Conjunctivae and EOM are normal. Pupils are equal, round, and reactive to light. No scleral icterus.  Neck: Normal range of motion. Neck supple. No JVD present. No tracheal deviation present. No thyromegaly present.  Cardiovascular: Normal rate, regular rhythm and normal heart sounds.  Exam reveals no gallop and no friction rub.   No murmur heard. Pulmonary/Chest: Effort normal and breath sounds normal. No respiratory distress. She has no wheezes. She exhibits no tenderness.  Abdominal: Soft. Bowel sounds are normal. She exhibits no distension  and no mass. There is no tenderness. There is no rebound and no guarding.  Musculoskeletal: Normal range of motion. She exhibits no edema or tenderness.  Lymphadenopathy:    She has no cervical adenopathy.  Neurological: She is alert and oriented to person, place, and time. No cranial nerve deficit. She exhibits normal muscle tone.  Normal strength and sensation in all extremities. Normal cerebellar testing.   Skin: Skin is warm and dry. No rash noted. No erythema. No pallor.  Nursing note and vitals reviewed.   ED Course  Procedures   DIAGNOSTIC STUDIES: Oxygen Saturation  is 100% on RA, normal by my interpretation.    COORDINATION OF CARE: 2:11 AM Discussed treatment plan which includes a migraine cocktail with pt at bedside and pt agreed to plan.  Labs Reviewed - No data to display  Imaging Review No results found.   MDM   Final diagnoses:  None   Patient presents to emergency department for headache. She describes onset is gradual, not acute. She states this is consistent with her chronic migraines. I have low suspicion for any serious intracranial abnormality. She was given Reglan, Benadryl, Motrin for pain control. She states her pain has improved but not resolved. She is mostly given prednisone and Compazine. She now states her headache is much better and she is ready to go home. She appears well and in no acute distress, vital signs within her normal limits and she is safe for discharge.    I personally performed the services described in this documentation, which was scribed in my presence. The recorded information has been reviewed and is accurate.      Everlene Balls, MD 04/26/15 [phone]

## 2015-06-17 ENCOUNTER — Encounter: Payer: Self-pay | Admitting: Internal Medicine

## 2017-05-14 ENCOUNTER — Other Ambulatory Visit: Payer: Self-pay

## 2017-05-14 ENCOUNTER — Encounter (HOSPITAL_COMMUNITY): Payer: Self-pay

## 2017-05-14 ENCOUNTER — Emergency Department (HOSPITAL_COMMUNITY)
Admission: EM | Admit: 2017-05-14 | Discharge: 2017-05-14 | Disposition: A | Discharge status: Home / Self Care | Payer: BLUE CROSS/BLUE SHIELD | Attending: Emergency Medicine | Admitting: Emergency Medicine

## 2017-05-14 DIAGNOSIS — B029 Zoster without complications: Secondary | ICD-10-CM

## 2017-05-14 DIAGNOSIS — M542 Cervicalgia: Secondary | ICD-10-CM | POA: Diagnosis present

## 2017-05-14 DIAGNOSIS — Z79899 Other long term (current) drug therapy: Secondary | ICD-10-CM | POA: Diagnosis not present

## 2017-05-14 MED ORDER — MORPHINE SULFATE (PF) 4 MG/ML IV SOLN
4.0000 mg | Freq: Once | INTRAVENOUS | Status: AC
Start: 2017-05-14 — End: 2017-05-14
  Administered 2017-05-14: 4 mg via INTRAVENOUS
  Filled 2017-05-14: qty 1

## 2017-05-14 MED ORDER — VALACYCLOVIR HCL 1 G PO TABS: 1000.0000 mg | ORAL_TABLET | Freq: Three times a day (TID) | ORAL | 0 refills | Status: AC

## 2017-05-14 MED ORDER — HYDROCODONE-ACETAMINOPHEN 5-325 MG PO TABS: 1.0000 | ORAL_TABLET | Freq: Four times a day (QID) | ORAL | 0 refills | Status: DC | PRN

## 2017-05-14 MED ORDER — ONDANSETRON HCL 4 MG/2ML IJ SOLN
4.0000 mg | Freq: Once | INTRAMUSCULAR | Status: AC
  Administered 2017-05-14: 4 mg via INTRAVENOUS
  Filled 2017-05-14: qty 2

## 2017-05-14 MED ORDER — GABAPENTIN 100 MG PO CAPS: 100.0000 mg | ORAL_CAPSULE | Freq: Three times a day (TID) | ORAL | 0 refills | Status: DC

## 2017-05-14 NOTE — ED Triage Notes (Signed)
Patient c/o left sided headache. Patient states the pain is a constant throbbing pain. Patient states she went to an UC yesterday and was given augmentin for swollen lymph nodes and possible shingles/. Patient states the pain has increased today. Patient has no visible rash or open areas.

## 2017-05-14 NOTE — ED Provider Notes (Signed)
[facility] DEPT Provider Note   CSN: [id number] Arrival date & time: 05/14/17  1227     History   Chief Complaint Chief Complaint  Patient presents with  . Adenopathy    left side neck    HPI Kelli Richmond is a 62 y.o. female.  The history is provided by the patient. No language interpreter was used.    Kelli Richmond is a 62 y.o. female who presents to the Emergency Department complaining of neck pain.  Reports 1 week of left-sided posterior neck and occipital pain.  The pain is constant in nature and described as throbbing.  It significantly worsened over the last 3 days.  She went to urgent care yesterday and they gave her a prescription for Augmentin.  She has not been able to sleep for the last few days because of pain.  She denies any vision changes, numbness, weakness, earaches, fevers.  She does feel nauseous related to her pain.  No prior similar symptoms.  She has a history of migraine headaches that are well controlled.  She does take estrogen.  Symptoms are severe, constant, worsening.  Past Medical History:  Diagnosis Date  . Chest pain    a. cath 3/12: no CAD; b. echo 3/12: EF 60%  . Depression   . Hyperlipidemia   . Migraine headache   . Postmenopausal   . Syncope 3/12   probably vasovagal    Patient Active Problem List   Diagnosis Date Noted  . Depression 10/22/2010  . Syncope 09/24/2010  . Abnormal EKG 09/24/2010    Past Surgical History:  Procedure Laterality Date  . APPENDECTOMY    . CESAREAN SECTION      OB History    No data available       Home Medications    Prior to Admission medications   Medication Sig Start Date End Date Taking? Authorizing Provider  DULoxetine (CYMBALTA) 60 MG capsule Take 60 mg by mouth daily.     Yes [provider]  estradiol (VIVELLE-DOT) 0.05 MG/24HR patch Place 1 patch onto the skin 2 (two) times a week.   Yes [provider]  Multiple Vitamin (ONE-A-DAY  ADULT VITACRAVES+DHA PO) Take by mouth.   Yes [provider]  rizatriptan (MAXALT-MLT) 10 MG disintegrating tablet DISSOLVE 1 TABLET BY MOUTH AS NEEDED FOR MIGRAINE, MAY REPEAT IN 2 HOURS IF NEEDED 02/11/17  Yes [provider]  topiramate (TOPAMAX) 100 MG tablet TAKE 2 TABLETS(200 MG) BY MOUTH DAILY 04/12/16  Yes [provider]  gabapentin (NEURONTIN) 100 MG capsule Take 1 capsule (100 mg total) by mouth 3 (three) times daily. 05/14/17   Quintella Reichert, MD  HYDROcodone-acetaminophen (NORCO/VICODIN) 5-325 MG tablet Take 1 tablet by mouth every 6 (six) hours as needed. 05/14/17   Quintella Reichert, MD  metoCLOPramide (REGLAN) 10 MG tablet Take 1 tablet (10 mg total) by mouth every 8 (eight) hours as needed (headache). 04/26/15   Everlene Balls, MD  valACYclovir (VALTREX) 1000 MG tablet Take 1 tablet (1,000 mg total) by mouth 3 (three) times daily for 7 days. 05/14/17 05/21/17  Quintella Reichert, MD    Family History Family History  Problem Relation Age of Onset  . Heart attack Father        CABG  . Bradycardia Mother        has a pacemaker  . Heart attack Sister 32       twin sister, MI treated with PCI    Social  History Social History   Tobacco Use  . Smoking status: Never Smoker  . Smokeless tobacco: Never Used  Substance Use Topics  . Alcohol use: No  . Drug use: No     Allergies   Patient has no known allergies.   Review of Systems Review of Systems  All other systems reviewed and are negative.    Physical Exam Updated Vital Signs BP 128/84   Pulse 94   Temp 97.6 F (36.4 C) (Oral)   Resp 18   Ht 5\' 3"  (1.6 m)   Wt 59.9 kg (132 lb)   SpO2 100%   BMI 23.38 kg/m   Physical Exam  Constitutional: She is oriented to person, place, and time. She appears well-developed and well-nourished.  HENT:  Head: Normocephalic and atraumatic.  Right Ear: External ear normal.  Left Ear: External ear normal.  Mouth/Throat: Oropharynx is clear and moist. No  oropharyngeal exudate.  Eyes: Conjunctivae and EOM are normal. Pupils are equal, round, and reactive to light.  Neck: Neck supple.  Mild posterior left-sided cervical lymphadenopathy.  Cardiovascular: Normal rate and regular rhythm.  No murmur heard. Pulmonary/Chest: Effort normal and breath sounds normal. No respiratory distress.  Abdominal: Soft. There is no tenderness. There is no rebound and no guarding.  Musculoskeletal: She exhibits no edema or tenderness.  Neurological: She is alert and oriented to person, place, and time. No cranial nerve deficit. Coordination normal.  5/5 strength in all four extremities.    Skin: Skin is warm and dry.  Scattered vesicles and erythema on left occipital scalp, left upper lip.    Psychiatric: She has a normal mood and affect. Her behavior is normal.  Nursing note and vitals reviewed.    ED Treatments / Results  Labs (all labs ordered are listed, but only abnormal results are displayed) Labs Reviewed - No data to display  EKG  EKG Interpretation None       Radiology No results found.  Procedures Procedures (including critical care time)  Medications Ordered in ED Medications  ondansetron (ZOFRAN) injection 4 mg (4 mg Intravenous Given 05/14/17 1810)  morphine 4 MG/ML injection 4 mg (4 mg Intravenous Given 05/14/17 1810)     Initial Impression / Assessment and Plan / ED Course  I have reviewed the triage vital signs and the nursing notes.  Pertinent labs & imaging results that were available during my care of the patient were reviewed by me and considered in my medical decision making (see chart for details).     Patient here for evaluation of left-sided posterior neck pain.  Examination is consistent with shingles.  She does have some mild lymphadenopathy that is likely reactive lymph nodes.  There is no evidence of current cellulitis or significant bacterial infection.  Discussed with patient discontinuing the antibiotics and  starting treatment for shingles.  Discussed home pain control, outpatient follow-up as well as return precautions.  Final Clinical Impressions(s) / ED Diagnoses   Final diagnoses:  Herpes zoster without complication    ED Discharge Orders        Ordered    valACYclovir (VALTREX) 1000 MG tablet  3 times daily     05/14/17 1818    HYDROcodone-acetaminophen (NORCO/VICODIN) 5-325 MG tablet  Every 6 hours PRN     05/14/17 1818    gabapentin (NEURONTIN) 100 MG capsule  3 times daily     05/14/17 1818       Quintella Reichert, MD 05/15/17 [phone]

## 2018-03-07 ENCOUNTER — Encounter: Payer: Self-pay | Admitting: Gastroenterology

## 2019-05-25 ENCOUNTER — Other Ambulatory Visit: Payer: Self-pay | Admitting: Obstetrics and Gynecology

## 2019-05-25 DIAGNOSIS — R928 Other abnormal and inconclusive findings on diagnostic imaging of breast: Secondary | ICD-10-CM

## 2019-05-30 ENCOUNTER — Other Ambulatory Visit: Payer: Self-pay | Admitting: Obstetrics and Gynecology

## 2019-05-30 ENCOUNTER — Ambulatory Visit
Admission: RE | Admit: 2019-05-30 | Discharge: 2019-05-30 | Disposition: A | Discharge status: Home / Self Care | Payer: BLUE CROSS/BLUE SHIELD | Source: Ambulatory Visit | Attending: Obstetrics and Gynecology | Admitting: Obstetrics and Gynecology

## 2019-05-30 ENCOUNTER — Other Ambulatory Visit: Payer: Self-pay

## 2019-05-30 DIAGNOSIS — R928 Other abnormal and inconclusive findings on diagnostic imaging of breast: Secondary | ICD-10-CM

## 2019-10-13 ENCOUNTER — Inpatient Hospital Stay (HOSPITAL_COMMUNITY)
Admission: EM | Admit: 2019-10-13 | Discharge: 2019-10-18 | DRG: 177 | Disposition: A | Discharge status: Home / Self Care | Payer: BC Managed Care – PPO | Attending: Internal Medicine | Admitting: Internal Medicine

## 2019-10-13 ENCOUNTER — Encounter (HOSPITAL_COMMUNITY): Payer: Self-pay | Admitting: Emergency Medicine

## 2019-10-13 ENCOUNTER — Other Ambulatory Visit: Payer: Self-pay

## 2019-10-13 DIAGNOSIS — E860 Dehydration: Secondary | ICD-10-CM | POA: Diagnosis not present

## 2019-10-13 DIAGNOSIS — F329 Major depressive disorder, single episode, unspecified: Secondary | ICD-10-CM | POA: Diagnosis present

## 2019-10-13 DIAGNOSIS — E785 Hyperlipidemia, unspecified: Secondary | ICD-10-CM | POA: Diagnosis present

## 2019-10-13 DIAGNOSIS — Z79899 Other long term (current) drug therapy: Secondary | ICD-10-CM

## 2019-10-13 DIAGNOSIS — K529 Noninfective gastroenteritis and colitis, unspecified: Secondary | ICD-10-CM | POA: Diagnosis present

## 2019-10-13 DIAGNOSIS — E876 Hypokalemia: Secondary | ICD-10-CM | POA: Diagnosis present

## 2019-10-13 DIAGNOSIS — U071 COVID-19: Secondary | ICD-10-CM | POA: Diagnosis not present

## 2019-10-13 DIAGNOSIS — G43909 Migraine, unspecified, not intractable, without status migrainosus: Secondary | ICD-10-CM | POA: Diagnosis present

## 2019-10-13 DIAGNOSIS — R197 Diarrhea, unspecified: Secondary | ICD-10-CM

## 2019-10-13 DIAGNOSIS — F32A Depression, unspecified: Secondary | ICD-10-CM | POA: Diagnosis present

## 2019-10-13 DIAGNOSIS — Z9049 Acquired absence of other specified parts of digestive tract: Secondary | ICD-10-CM

## 2019-10-13 DIAGNOSIS — N179 Acute kidney failure, unspecified: Secondary | ICD-10-CM | POA: Diagnosis present

## 2019-10-13 DIAGNOSIS — J1282 Pneumonia due to coronavirus disease 2019: Secondary | ICD-10-CM | POA: Diagnosis present

## 2019-10-13 DIAGNOSIS — Z79891 Long term (current) use of opiate analgesic: Secondary | ICD-10-CM

## 2019-10-13 LAB — URINALYSIS, ROUTINE W REFLEX MICROSCOPIC
Bilirubin Urine: NEGATIVE
Glucose, UA: NEGATIVE mg/dL
Ketones, ur: NEGATIVE mg/dL
Nitrite: NEGATIVE
Protein, ur: 30 mg/dL — AB
Specific Gravity, Urine: 1.018 (ref 1.005–1.030)
pH: 5 (ref 5.0–8.0)

## 2019-10-13 LAB — CBC WITH DIFFERENTIAL/PLATELET
Abs Immature Granulocytes: 0.03 10*3/uL (ref 0.00–0.07)
Basophils Absolute: 0 10*3/uL (ref 0.0–0.1)
Basophils Relative: 0 %
Eosinophils Absolute: 0 10*3/uL (ref 0.0–0.5)
Eosinophils Relative: 0 %
HCT: 39.6 % (ref 36.0–46.0)
Hemoglobin: 13.6 g/dL (ref 12.0–15.0)
Immature Granulocytes: 1 %
Lymphocytes Relative: 10 %
Lymphs Abs: 0.6 10*3/uL — ABNORMAL LOW (ref 0.7–4.0)
MCH: 32.6 pg (ref 26.0–34.0)
MCHC: 34.3 g/dL (ref 30.0–36.0)
MCV: 95 fL (ref 80.0–100.0)
Monocytes Absolute: 0.3 10*3/uL (ref 0.1–1.0)
Monocytes Relative: 6 %
Neutro Abs: 4.7 10*3/uL (ref 1.7–7.7)
Neutrophils Relative %: 83 %
Platelets: 192 10*3/uL (ref 150–400)
RBC: 4.17 MIL/uL (ref 3.87–5.11)
RDW: 12.1 % (ref 11.5–15.5)
WBC: 5.6 10*3/uL (ref 4.0–10.5)
nRBC: 0 % (ref 0.0–0.2)

## 2019-10-13 LAB — COMPREHENSIVE METABOLIC PANEL
ALT: 17 U/L (ref 0–44)
AST: 39 U/L (ref 15–41)
Albumin: 3.2 g/dL — ABNORMAL LOW (ref 3.5–5.0)
Alkaline Phosphatase: 66 U/L (ref 38–126)
Anion gap: 13 (ref 5–15)
BUN: 43 mg/dL — ABNORMAL HIGH (ref 8–23)
CO2: 18 mmol/L — ABNORMAL LOW (ref 22–32)
Calcium: 8.7 mg/dL — ABNORMAL LOW (ref 8.9–10.3)
Chloride: 107 mmol/L (ref 98–111)
Creatinine, Ser: 2.42 mg/dL — ABNORMAL HIGH (ref 0.44–1.00)
GFR calc Af Amer: 24 mL/min — ABNORMAL LOW (ref >60)
GFR calc non Af Amer: 20 mL/min — ABNORMAL LOW (ref >60)
Glucose, Bld: 107 mg/dL — ABNORMAL HIGH (ref 70–99)
Potassium: 3.1 mmol/L — ABNORMAL LOW (ref 3.5–5.1)
Sodium: 138 mmol/L (ref 135–145)
Total Bilirubin: 0.5 mg/dL (ref 0.3–1.2)
Total Protein: 6.7 g/dL (ref 6.5–8.1)

## 2019-10-13 NOTE — ED Triage Notes (Signed)
Pt states she is been having diarrhea, fever and sinus infection for about a week and is taking her PO abx as ordered, pt states she feel dihydrated.

## 2019-10-14 ENCOUNTER — Inpatient Hospital Stay (HOSPITAL_COMMUNITY): Payer: BC Managed Care – PPO

## 2019-10-14 DIAGNOSIS — G43909 Migraine, unspecified, not intractable, without status migrainosus: Secondary | ICD-10-CM | POA: Diagnosis present

## 2019-10-14 DIAGNOSIS — E785 Hyperlipidemia, unspecified: Secondary | ICD-10-CM | POA: Diagnosis present

## 2019-10-14 DIAGNOSIS — R197 Diarrhea, unspecified: Secondary | ICD-10-CM | POA: Diagnosis not present

## 2019-10-14 DIAGNOSIS — E860 Dehydration: Secondary | ICD-10-CM | POA: Diagnosis present

## 2019-10-14 DIAGNOSIS — K529 Noninfective gastroenteritis and colitis, unspecified: Secondary | ICD-10-CM | POA: Diagnosis present

## 2019-10-14 DIAGNOSIS — Z79899 Other long term (current) drug therapy: Secondary | ICD-10-CM | POA: Diagnosis not present

## 2019-10-14 DIAGNOSIS — E876 Hypokalemia: Secondary | ICD-10-CM | POA: Diagnosis present

## 2019-10-14 DIAGNOSIS — J1282 Pneumonia due to coronavirus disease 2019: Secondary | ICD-10-CM | POA: Diagnosis present

## 2019-10-14 DIAGNOSIS — Z9049 Acquired absence of other specified parts of digestive tract: Secondary | ICD-10-CM | POA: Diagnosis not present

## 2019-10-14 DIAGNOSIS — U071 COVID-19: Secondary | ICD-10-CM | POA: Diagnosis present

## 2019-10-14 DIAGNOSIS — F329 Major depressive disorder, single episode, unspecified: Secondary | ICD-10-CM | POA: Diagnosis present

## 2019-10-14 DIAGNOSIS — N179 Acute kidney failure, unspecified: Secondary | ICD-10-CM | POA: Diagnosis present

## 2019-10-14 DIAGNOSIS — Z79891 Long term (current) use of opiate analgesic: Secondary | ICD-10-CM | POA: Diagnosis not present

## 2019-10-14 LAB — SARS CORONAVIRUS 2 BY RT PCR (HOSPITAL ORDER, PERFORMED IN ~~LOC~~ HOSPITAL LAB): SARS Coronavirus 2: POSITIVE — AB

## 2019-10-14 LAB — HIV ANTIBODY (ROUTINE TESTING W REFLEX): HIV Screen 4th Generation wRfx: NONREACTIVE

## 2019-10-14 LAB — D-DIMER, QUANTITATIVE: D-Dimer, Quant: 1.16 ug/mL-FEU — ABNORMAL HIGH (ref 0.00–0.50)

## 2019-10-14 LAB — PROCALCITONIN: Procalcitonin: 0.16 ng/mL

## 2019-10-14 LAB — FERRITIN: Ferritin: 349 ng/mL — ABNORMAL HIGH (ref 11–307)

## 2019-10-14 LAB — FIBRINOGEN: Fibrinogen: 524 mg/dL — ABNORMAL HIGH (ref 210–475)

## 2019-10-14 LAB — LACTATE DEHYDROGENASE: LDH: 334 U/L — ABNORMAL HIGH (ref 98–192)

## 2019-10-14 LAB — C-REACTIVE PROTEIN: CRP: 19.4 mg/dL — ABNORMAL HIGH (ref <1.0)

## 2019-10-14 MED ORDER — DEXAMETHASONE SODIUM PHOSPHATE 10 MG/ML IJ SOLN
6.0000 mg | INTRAMUSCULAR | Status: DC
  Administered 2019-10-14 – 2019-10-17 (×4): 6 mg via INTRAVENOUS
  Filled 2019-10-14 (×4): qty 1

## 2019-10-14 MED ORDER — POTASSIUM CHLORIDE CRYS ER 20 MEQ PO TBCR
40.0000 meq | EXTENDED_RELEASE_TABLET | Freq: Once | ORAL | Status: AC
  Administered 2019-10-14: 40 meq via ORAL
  Filled 2019-10-14: qty 2

## 2019-10-14 MED ORDER — GUAIFENESIN-DM 100-10 MG/5ML PO SYRP: 10.0000 mL | ORAL_SOLUTION | ORAL | Status: DC | PRN

## 2019-10-14 MED ORDER — LACTATED RINGERS IV SOLN: INTRAVENOUS | Status: DC

## 2019-10-14 MED ORDER — OXYCODONE HCL 5 MG PO TABS: 5.0000 mg | ORAL_TABLET | ORAL | Status: DC | PRN

## 2019-10-14 MED ORDER — ONDANSETRON HCL 4 MG/2ML IJ SOLN
4.0000 mg | Freq: Four times a day (QID) | INTRAMUSCULAR | Status: DC | PRN
  Administered 2019-10-17 – 2019-10-18 (×2): 4 mg via INTRAVENOUS
  Filled 2019-10-14 (×2): qty 2

## 2019-10-14 MED ORDER — SODIUM CHLORIDE 0.9% FLUSH: 3.0000 mL | INTRAVENOUS | Status: DC | PRN

## 2019-10-14 MED ORDER — DULOXETINE HCL 60 MG PO CPEP
60.0000 mg | ORAL_CAPSULE | Freq: Every day | ORAL | Status: DC
  Administered 2019-10-15 – 2019-10-18 (×4): 60 mg via ORAL
  Filled 2019-10-14 (×6): qty 1

## 2019-10-14 MED ORDER — TOPIRAMATE 25 MG PO TABS
250.0000 mg | ORAL_TABLET | Freq: Every day | ORAL | Status: DC
  Administered 2019-10-15 – 2019-10-18 (×4): 250 mg via ORAL
  Filled 2019-10-14 (×4): qty 2
  Filled 2019-10-14: qty 10

## 2019-10-14 MED ORDER — SODIUM CHLORIDE 0.9 % IV SOLN
200.0000 mg | Freq: Once | INTRAVENOUS | Status: AC
  Administered 2019-10-14: 200 mg via INTRAVENOUS
  Filled 2019-10-14: qty 40

## 2019-10-14 MED ORDER — ENOXAPARIN SODIUM 30 MG/0.3ML ~~LOC~~ SOLN
30.0000 mg | SUBCUTANEOUS | Status: DC
  Administered 2019-10-14: 30 mg via SUBCUTANEOUS
  Filled 2019-10-14: qty 0.3

## 2019-10-14 MED ORDER — LACTATED RINGERS IV BOLUS
1000.0000 mL | Freq: Once | INTRAVENOUS | Status: AC
  Administered 2019-10-14: 1000 mL via INTRAVENOUS

## 2019-10-14 MED ORDER — SODIUM CHLORIDE 0.9 % IV SOLN: 250.0000 mL | INTRAVENOUS | Status: DC | PRN

## 2019-10-14 MED ORDER — SODIUM CHLORIDE 0.9 % IV SOLN
100.0000 mg | Freq: Every day | INTRAVENOUS | Status: AC
  Administered 2019-10-15 – 2019-10-18 (×4): 100 mg via INTRAVENOUS
  Filled 2019-10-14 (×4): qty 20

## 2019-10-14 MED ORDER — HYDROCOD POLST-CPM POLST ER 10-8 MG/5ML PO SUER: 5.0000 mL | Freq: Two times a day (BID) | ORAL | Status: DC | PRN

## 2019-10-14 MED ORDER — BISMUTH SUBSALICYLATE 262 MG/15ML PO SUSP
30.0000 mL | Freq: Once | ORAL | Status: DC
  Filled 2019-10-14: qty 236

## 2019-10-14 MED ORDER — SODIUM CHLORIDE 0.9% FLUSH: 3.0000 mL | Freq: Two times a day (BID) | INTRAVENOUS | Status: DC

## 2019-10-14 MED ORDER — ACETAMINOPHEN 325 MG PO TABS
650.0000 mg | ORAL_TABLET | Freq: Four times a day (QID) | ORAL | Status: DC | PRN
  Administered 2019-10-14: 650 mg via ORAL
  Filled 2019-10-14: qty 2

## 2019-10-14 MED ORDER — ALBUTEROL SULFATE HFA 108 (90 BASE) MCG/ACT IN AERS
2.0000 | INHALATION_SPRAY | RESPIRATORY_TRACT | Status: DC | PRN
  Filled 2019-10-14: qty 6.7

## 2019-10-14 NOTE — ED Notes (Signed)
tele Dinner ordered

## 2019-10-14 NOTE — ED Notes (Signed)
Pt refuses COVID test

## 2019-10-14 NOTE — ED Notes (Signed)
Pt eating at this time.

## 2019-10-14 NOTE — H&P (Addendum)
History and Physical    Kelli Richmond:[id number] DOB: 12-22-1955 DOA: 10/13/2019  PCP: Chesley Noon, MD Consultants:  Joretta Bachelor - neurology; Caryl Comes - cardiology Patient coming from:  Home - lives with husband; NOK: Husband, [phone]   Chief Complaint:  Diarrhea, fever  HPI: Kelli Richmond is a 64 y.o. female with medical history significant of syncope; migraines; and HLD presenting with diarrhea, fever.  Last Thursday, she "started getting sick".  The doctor assumed it was sinusitis and called in a Z-pack - but she didn't start it until Wednesday.  Wednesday, she was not feeling any better.  She got "severely dehydrated since then".  She developed diarrhea.  Vomiting maybe once or twice, hasn't been able to eat.  Imodium would work until she tried to eat anything.  She wasn't drinking much.  She was having maybe 2-3 stools per day, last stool yesterday.  She came to the ER about 8pm last night.  She had a similar episode about 5 years ago.  She saw Clarise Cruz on Monday for her annual, convinced she does not have COVID.  She refused the vaccine - her dermatologist did not recommend it.   She also adamantly refused to be tested for COVID, but was ultimately offered the option of testing vs. Remaining a PUI on the COVID floor and agreed to be tested.    ED Course:  URI symptoms x 1 week with diarrhea.  Given Z-pack via televisit, taken 3/5 days.  Diarrhea, feels weak.  Creatinine up to 2.5, prior normal.  Likely needs overnight obs.  Stool studies sent, diarrhea before antibiotics.  COVID pending.  Review of Systems: As per HPI; otherwise review of systems reviewed and negative.   Ambulatory Status:  Ambulates without assistance  COVID Vaccine Status:  None  Past Medical History:  Diagnosis Date  . Chest pain    a. cath 3/12: no CAD; b. echo 3/12: EF 60%  . Depression   . Hyperlipidemia   . Migraine headache   . Postmenopausal   . Syncope 3/12   probably vasovagal    Past  Surgical History:  Procedure Laterality Date  . APPENDECTOMY    . CESAREAN SECTION      Social History   Socioeconomic History  . Marital status: Married    Spouse name: Not on file  . Number of children: Not on file  . Years of education: Not on file  . Highest education level: Not on file  Occupational History  . Not on file  Tobacco Use  . Smoking status: Never Smoker  . Smokeless tobacco: Never Used  Substance and Sexual Activity  . Alcohol use: No  . Drug use: No  . Sexual activity: Not on file  Other Topics Concern  . Not on file  Social History Narrative    The patient does not smoke, drink, or use any drugs.     She is a relatively active person and reports working out approximately     3 times a week before she started having these pains.    Social Determinants of Health   Financial Resource Strain:   . Difficulty of Paying Living Expenses:   Food Insecurity:   . Worried About Charity fundraiser in the Last Year:   . Arboriculturist in the Last Year:   Transportation Needs:   . Film/video editor (Medical):   Marland Kitchen Lack of Transportation (Non-Medical):   Physical Activity:   . Days of  Exercise per Week:   . Minutes of Exercise per Session:   Stress:   . Feeling of Stress :   Social Connections:   . Frequency of Communication with Friends and Family:   . Frequency of Social Gatherings with Friends and Family:   . Attends Religious Services:   . Active Member of Clubs or Organizations:   . Attends Archivist Meetings:   Marland Kitchen Marital Status:   Intimate Partner Violence:   . Fear of Current or Ex-Partner:   . Emotionally Abused:   Marland Kitchen Physically Abused:   . Sexually Abused:     No Known Allergies  Family History  Problem Relation Age of Onset  . Heart attack Father        CABG  . Bradycardia Mother        has a pacemaker  . Heart attack Sister 46       twin sister, MI treated with PCI    Prior to Admission medications   Medication Sig  Start Date End Date Taking? Authorizing Provider  DULoxetine (CYMBALTA) 60 MG capsule Take 60 mg by mouth daily.      [provider]  estradiol (VIVELLE-DOT) 0.05 MG/24HR patch Place 1 patch onto the skin 2 (two) times a week.    [provider]  gabapentin (NEURONTIN) 100 MG capsule Take 1 capsule (100 mg total) by mouth 3 (three) times daily. 05/14/17   Quintella Reichert, MD  HYDROcodone-acetaminophen (NORCO/VICODIN) 5-325 MG tablet Take 1 tablet by mouth every 6 (six) hours as needed. 05/14/17   Quintella Reichert, MD  metoCLOPramide (REGLAN) 10 MG tablet Take 1 tablet (10 mg total) by mouth every 8 (eight) hours as needed (headache). 04/26/15   Everlene Balls, MD  Multiple Vitamin (ONE-A-DAY ADULT VITACRAVES+DHA PO) Take by mouth.    [provider]  rizatriptan (MAXALT-MLT) 10 MG disintegrating tablet DISSOLVE 1 TABLET BY MOUTH AS NEEDED FOR MIGRAINE, MAY REPEAT IN 2 HOURS IF NEEDED 02/11/17   [provider]  topiramate (TOPAMAX) 100 MG tablet TAKE 2 TABLETS(200 MG) BY MOUTH DAILY 04/12/16   [provider]    Physical Exam: Vitals:   10/14/19 0703 10/14/19 1543 10/14/19 1615 10/14/19 1615  BP: 122/88     Pulse: 79     Resp: 17     Temp: 98 F (36.7 C) (!) 102.6 F (39.2 C)    TempSrc:  Oral    SpO2: 98%  98% 98%  Weight:      Height:         . General:  Appears calm and comfortable and is NAD . Eyes:  PERRL, EOMI, normal lids, iris . ENT:  grossly normal hearing, lips & tongue, mmm; appropriate dentition . Neck:  no LAD, masses or thyromegaly . Cardiovascular:  RRR, no m/r/g. No LE edema.  Marland Kitchen Respiratory:   Scattered rhonchi.  Normal respiratory effort. . Abdomen:  soft, NT, ND, NABS . Skin:  Rosacea on R > L malar region . Musculoskeletal:  grossly normal tone BUE/BLE, good ROM, no bony abnormality . Psychiatric:  anxious mood and affect, speech fluent and appropriate, AOx3 . Neurologic:  CN 2-12 grossly intact, moves all extremities in  coordinated fashion    Radiological Exams on Admission: DG CHEST PORT 1 VIEW  Result Date: 10/14/2019 CLINICAL DATA:  COVID positive with fever. EXAM: PORTABLE CHEST 1 VIEW COMPARISON:  July 14, 2010 FINDINGS: Mild infiltrates are seen within the bilateral lung bases and mid lung fields, right slightly  greater than left. There is no evidence of a pleural effusion or pneumothorax. The heart size and mediastinal contours are within normal limits. The visualized skeletal structures are unremarkable. IMPRESSION: Mild bilateral infiltrates. Electronically Signed   By: Virgina Norfolk M.D.   On: 10/14/2019 15:43    EKG: not done   Labs on Admission: I have personally reviewed the available labs and imaging studies at the time of the admission.  Pertinent labs:   K+ 3.1 CO2 18 Glucose 107 BUN 43/Creatinine 2.42/GFR 20; 12/0.82/76 on 5/24 Albumin 3.2 Normal CBC with lymphopenia UA: small Hgb, moderate LE, 30 protein, rare bacteria LDH 334 Ferritin 349 CRP 19.4 (very elevated!) Procalcitonin 0.16 D-dimer 1.16 Fibrinogen 524 COVID POSITIVE A1c on 5/24 5.0 Lipids on 5/24: 243/58/164/116   Assessment/Plan Principal Problem:   COVID-19 Active Problems:   Depression   Dyslipidemia    Acute infection with COVID-19 -Patient with presenting with URI symptoms and diarrhea with dehydration -She was initially incredibly reluctant to agree to COVID-19 testing and this led to a delay in diagnosis and treatment -She does not have a current O2 requirement  -COVID POSITIVE -The patient has no comorbidities which may increase the risk for ARDS/MODS -Pertinent labs concerning for COVID include lymphopenia; increased BUN/Creatinine; increased LDH; markedly elevated D-dimer (>1); increased ferritin; low procalcitonin; markedly elevated CRP (>>7); increased fibrinogen -CXR with multifocal opacities which may be c/w COVID vs. Multifocal PNA -Will not treat with broad-spectrum antibiotics given  procalcitonin <0.25 -Will admit for further evaluation, close monitoring, and treatment -Monitor on telemetry x at least 24 hours -At this time, will attempt to avoid use of aerosolized medications and use HFAs instead -Will check daily labs including BMP with Mag, Phos; LFTs; CBC with differential; CRP; ferritin; fibrinogen; D-dimer -Will order steroids and Remdesivir (pharmacy consult) given +COVID test, +CXR -If the patient shows clinical deterioration, consider transfer to ICU with PCCM consultation -Consider Tocilizumab and/or convalescent plasma if the patient does not stabilize on current treatment or if the patient has marked clinical decompensation; the patient does not appear to require these treatments at this time. -Will attempt to maintain euvolemia to a net negative fluid status; she does appear to be dry at this time and is having ongoing mild diarrhea so will continue gentle IVF hydration at this time -With D-dimer <5, will use standard-dosed Lovenox for DVT prevention; hold estradiol for now given increased risk of VTE with COVID and estrogen -Patient was seen wearing full PPE including: gown, gloves, head cover, N95, and face shield; donning and doffing was in compliance with current standards.  Depression -Continue Cymbalta, Topamax  HLD -Continue Crestor  Hypokalemia -Likely related to GI losses -Will replete and recheck in AM   DVT prophylaxis:  Lovenox  Code Status:  Full - confirmed with patient Family Communication: Daughter was present throughout evaluation but was asked to leave when patient's COVID test reported positive Disposition Plan:  The patient is from: home  Anticipated d/c is to: home without [facility] services once her respiratory issues have been resolved.  She may require home O2 at the time of discharge.  Anticipated d/c date will depend on clinical response to treatment, likely between 3 days (with completion of outpatient Remdesivir treatment) and 5  days  Patient is currently: acutely ill Consults called: None  Admission status: Admit - It is my clinical opinion that admission to INPATIENT is reasonable and necessary because of the expectation that this patient will require hospital care that crosses at least  2 midnights to treat this condition based on the medical complexity of the problems presented.  Given the aforementioned information, the predictability of an adverse outcome is felt to be significant.         Karmen Bongo MD [facility]   How to contact the [facility] Attending or Consulting provider [place] or covering provider during after hours [place], for this patient?  1. Check the care team in [facility] and look for a) attending/consulting [facility] provider listed and b) the [facility] team listed 2. Log into [email or web address] and use [place]'s universal password to access. If you do not have the password, please contact the hospital operator. 3. Locate the [facility] provider you are looking for under [facility] and page to a number that you can be directly reached. 4. If you still have difficulty reaching the provider, please page the [facility] (Director on Call) for the Hospitalists listed on amion for assistance.   10/14/2019, 5:34 PM

## 2019-10-14 NOTE — ED Provider Notes (Signed)
[facility] EMERGENCY DEPARTMENT Provider Note   CSN: [id number] Arrival date & time: 10/13/19  2043     History Chief Complaint  Patient presents with  . Diarrhea  . Fever    Kelli Richmond is a 64 y.o. female.  HPI Patient presents with diarrhea dehydration nasal congestion fevers.  Has had for around a week.  Started with sinus stuff with some diarrhea.  Discussed with PCP on phone wrote for a Z-Pak.  However she wait a few days before starting that is not on 3 days of the Z-Pak.  However has had more diarrhea.  Feels lightheaded feels dizzy.  Patient and family member upset because during the long wait in the waiting room they were unable to be given anything to eat.  States that the staff was rude.  Patient not having abdominal pain.  Has been drinking some Gatorade but she does not like Gatorade.  Has had watery diarrhea that is dark.  Besides the Z-Pak no recent antibiotics.  No recent travel.  Had recent blood work and had normal kidney function at that time.    Past Medical History:  Diagnosis Date  . Chest pain    a. cath 3/12: no CAD; b. echo 3/12: EF 60%  . Depression   . Hyperlipidemia   . Migraine headache   . Postmenopausal   . Syncope 3/12   probably vasovagal    Patient Active Problem List   Diagnosis Date Noted  . Depression 10/22/2010  . Syncope 09/24/2010  . Abnormal EKG 09/24/2010    Past Surgical History:  Procedure Laterality Date  . APPENDECTOMY    . CESAREAN SECTION       OB History   No obstetric history on file.     Family History  Problem Relation Age of Onset  . Heart attack Father        CABG  . Bradycardia Mother        has a pacemaker  . Heart attack Sister 27       twin sister, MI treated with PCI    Social History   Tobacco Use  . Smoking status: Never Smoker  . Smokeless tobacco: Never Used  Substance Use Topics  . Alcohol use: No  . Drug use: No    Home Medications Prior to Admission  medications   Medication Sig Start Date End Date Taking? Authorizing Provider  DULoxetine (CYMBALTA) 60 MG capsule Take 60 mg by mouth daily.      [provider]  estradiol (VIVELLE-DOT) 0.05 MG/24HR patch Place 1 patch onto the skin 2 (two) times a week.    [provider]  gabapentin (NEURONTIN) 100 MG capsule Take 1 capsule (100 mg total) by mouth 3 (three) times daily. 05/14/17   Quintella Reichert, MD  HYDROcodone-acetaminophen (NORCO/VICODIN) 5-325 MG tablet Take 1 tablet by mouth every 6 (six) hours as needed. 05/14/17   Quintella Reichert, MD  metoCLOPramide (REGLAN) 10 MG tablet Take 1 tablet (10 mg total) by mouth every 8 (eight) hours as needed (headache). 04/26/15   Everlene Balls, MD  Multiple Vitamin (ONE-A-DAY ADULT VITACRAVES+DHA PO) Take by mouth.    [provider]  rizatriptan (MAXALT-MLT) 10 MG disintegrating tablet DISSOLVE 1 TABLET BY MOUTH AS NEEDED FOR MIGRAINE, MAY REPEAT IN 2 HOURS IF NEEDED 02/11/17   [provider]  topiramate (TOPAMAX) 100 MG tablet TAKE 2 TABLETS(200 MG) BY MOUTH DAILY 04/12/16   [provider]    Allergies  Patient has no known allergies.  Review of Systems   Review of Systems  Constitutional: Positive for appetite change and fever.  HENT: Positive for congestion.   Respiratory: Negative for shortness of breath.   Gastrointestinal: Positive for diarrhea and nausea. Negative for abdominal pain and vomiting.  Genitourinary: Negative for flank pain.  Musculoskeletal: Negative for arthralgias.  Skin: Negative for rash.  Neurological: Positive for weakness.  Psychiatric/Behavioral: Negative for confusion.    Physical Exam Updated Vital Signs BP 122/88   Pulse 79   Temp 98 F (36.7 C)   Resp 17   Ht 5\' 2"  (1.575 m)   Wt 63 kg   SpO2 98%   BMI 25.42 kg/m   Physical Exam Vitals reviewed.  HENT:     Head: Atraumatic.     Mouth/Throat:     Mouth: Mucous membranes are dry.  Eyes:     Pupils:  Pupils are equal, round, and reactive to light.  Cardiovascular:     Rate and Rhythm: Normal rate.  Abdominal:     Tenderness: There is no abdominal tenderness.  Musculoskeletal:        General: No tenderness.     Cervical back: Neck supple.  Skin:    Capillary Refill: Capillary refill takes less than 2 seconds.     Coloration: Skin is pale.  Neurological:     Mental Status: She is alert and oriented to person, place, and time.     ED Results / Procedures / Treatments   Labs (all labs ordered are listed, but only abnormal results are displayed) Labs Reviewed  CBC WITH DIFFERENTIAL/PLATELET - Abnormal; Notable for the following components:      Result Value   Lymphs Abs 0.6 (*)    All other components within normal limits  COMPREHENSIVE METABOLIC PANEL - Abnormal; Notable for the following components:   Potassium 3.1 (*)    CO2 18 (*)    Glucose, Bld 107 (*)    BUN 43 (*)    Creatinine, Ser 2.42 (*)    Calcium 8.7 (*)    Albumin 3.2 (*)    GFR calc non Af Amer 20 (*)    GFR calc Af Amer 24 (*)    All other components within normal limits  URINALYSIS, ROUTINE W REFLEX MICROSCOPIC - Abnormal; Notable for the following components:   APPearance CLOUDY (*)    Hgb urine dipstick SMALL (*)    Protein, ur 30 (*)    Leukocytes,Ua MODERATE (*)    Bacteria, UA RARE (*)    All other components within normal limits  GASTROINTESTINAL PANEL BY PCR, STOOL (REPLACES STOOL CULTURE)    EKG None  Radiology No results found.  Procedures Procedures (including critical care time)  Medications Ordered in ED Medications  lactated ringers bolus 1,000 mL (has no administration in time range)    ED Course  I have reviewed the triage vital signs and the nursing notes.  Pertinent labs & imaging results that were available during my care of the patient were reviewed by me and considered in my medical decision making (see chart for details).    MDM Rules/Calculators/A&P                       Patient presents with diarrhea sinus symptoms and fevers.  Has had decreased oral intake going along with the diarrhea.  Has a new elevated creatinine up to 2.5.  With the acute kidney injury and decreased oral intake  at home I feels the patient benefit from [place] to the hospital.  Will give IV fluid boluses.  Stool studies have been done.  Will admit to unassigned medicine Final Clinical Impression(s) / ED Diagnoses Final diagnoses:  Diarrhea, unspecified type  Dehydration  AKI (acute kidney injury) [facility])    Rx / [place] Orders ED Discharge Orders    None       Davonna Belling, MD 10/14/19 [phone]

## 2019-10-14 NOTE — ED Notes (Signed)
Pt step outside 

## 2019-10-15 DIAGNOSIS — N179 Acute kidney failure, unspecified: Secondary | ICD-10-CM

## 2019-10-15 LAB — D-DIMER, QUANTITATIVE: D-Dimer, Quant: 0.7 ug/mL-FEU — ABNORMAL HIGH (ref 0.00–0.50)

## 2019-10-15 LAB — CBC WITH DIFFERENTIAL/PLATELET
Abs Immature Granulocytes: 0.03 10*3/uL (ref 0.00–0.07)
Basophils Absolute: 0 10*3/uL (ref 0.0–0.1)
Basophils Relative: 0 %
Eosinophils Absolute: 0 10*3/uL (ref 0.0–0.5)
Eosinophils Relative: 0 %
HCT: 36.9 % (ref 36.0–46.0)
Hemoglobin: 12.4 g/dL (ref 12.0–15.0)
Immature Granulocytes: 1 %
Lymphocytes Relative: 12 %
Lymphs Abs: 0.5 10*3/uL — ABNORMAL LOW (ref 0.7–4.0)
MCH: 32.5 pg (ref 26.0–34.0)
MCHC: 33.6 g/dL (ref 30.0–36.0)
MCV: 96.9 fL (ref 80.0–100.0)
Monocytes Absolute: 0.2 10*3/uL (ref 0.1–1.0)
Monocytes Relative: 4 %
Neutro Abs: 3.8 10*3/uL (ref 1.7–7.7)
Neutrophils Relative %: 83 %
Platelets: 196 10*3/uL (ref 150–400)
RBC: 3.81 MIL/uL — ABNORMAL LOW (ref 3.87–5.11)
RDW: 12.2 % (ref 11.5–15.5)
WBC: 4.6 10*3/uL (ref 4.0–10.5)
nRBC: 0 % (ref 0.0–0.2)

## 2019-10-15 LAB — COMPREHENSIVE METABOLIC PANEL
ALT: 18 U/L (ref 0–44)
AST: 41 U/L (ref 15–41)
Albumin: 2.7 g/dL — ABNORMAL LOW (ref 3.5–5.0)
Alkaline Phosphatase: 61 U/L (ref 38–126)
Anion gap: 15 (ref 5–15)
BUN: 20 mg/dL (ref 8–23)
CO2: 20 mmol/L — ABNORMAL LOW (ref 22–32)
Calcium: 8.3 mg/dL — ABNORMAL LOW (ref 8.9–10.3)
Chloride: 108 mmol/L (ref 98–111)
Creatinine, Ser: 1.17 mg/dL — ABNORMAL HIGH (ref 0.44–1.00)
GFR calc Af Amer: 57 mL/min — ABNORMAL LOW (ref >60)
GFR calc non Af Amer: 49 mL/min — ABNORMAL LOW (ref >60)
Glucose, Bld: 134 mg/dL — ABNORMAL HIGH (ref 70–99)
Potassium: 3.6 mmol/L (ref 3.5–5.1)
Sodium: 143 mmol/L (ref 135–145)
Total Bilirubin: 0.5 mg/dL (ref 0.3–1.2)
Total Protein: 6 g/dL — ABNORMAL LOW (ref 6.5–8.1)

## 2019-10-15 LAB — FERRITIN: Ferritin: 434 ng/mL — ABNORMAL HIGH (ref 11–307)

## 2019-10-15 LAB — C-REACTIVE PROTEIN: CRP: 21 mg/dL — ABNORMAL HIGH (ref <1.0)

## 2019-10-15 LAB — MAGNESIUM: Magnesium: 1.8 mg/dL (ref 1.7–2.4)

## 2019-10-15 LAB — PHOSPHORUS: Phosphorus: 3.6 mg/dL (ref 2.5–4.6)

## 2019-10-15 MED ORDER — FAMOTIDINE 20 MG PO TABS
20.0000 mg | ORAL_TABLET | Freq: Every day | ORAL | Status: DC
  Administered 2019-10-15 – 2019-10-18 (×4): 20 mg via ORAL
  Filled 2019-10-15 (×4): qty 1

## 2019-10-15 MED ORDER — LOPERAMIDE HCL 2 MG PO CAPS
2.0000 mg | ORAL_CAPSULE | ORAL | Status: DC | PRN
  Administered 2019-10-15 (×2): 2 mg via ORAL
  Filled 2019-10-15 (×2): qty 1

## 2019-10-15 MED ORDER — OXYCODONE HCL 5 MG PO TABS
5.0000 mg | ORAL_TABLET | Freq: Four times a day (QID) | ORAL | Status: DC | PRN
  Filled 2019-10-15: qty 1

## 2019-10-15 MED ORDER — SUMATRIPTAN SUCCINATE 100 MG PO TABS
100.0000 mg | ORAL_TABLET | ORAL | Status: DC | PRN
  Administered 2019-10-15 – 2019-10-18 (×5): 100 mg via ORAL
  Filled 2019-10-15 (×8): qty 1

## 2019-10-15 MED ORDER — ENOXAPARIN SODIUM 40 MG/0.4ML ~~LOC~~ SOLN
40.0000 mg | Freq: Every day | SUBCUTANEOUS | Status: DC
  Administered 2019-10-15 – 2019-10-18 (×4): 40 mg via SUBCUTANEOUS
  Filled 2019-10-15 (×4): qty 0.4

## 2019-10-15 MED ORDER — KCL IN DEXTROSE-NACL 20-5-0.9 MEQ/L-%-% IV SOLN
INTRAVENOUS | Status: DC
  Filled 2019-10-15 (×2): qty 1000

## 2019-10-15 NOTE — Progress Notes (Addendum)
PROGRESS NOTE                                                                                                                                                                                                             Patient Demographics:    Kelli Richmond, is a 64 y.o. female, DOB - 1955/06/13, JJO:[id number]  Admit date - 10/13/2019   Admitting Physician Karmen Bongo, MD  Outpatient Primary MD for the patient is Chesley Noon, MD  LOS - 1   Chief Complaint  Patient presents with  . Diarrhea  . Fever       Brief Narrative     HPI: Kelli Richmond is a 64 y.o. female with medical history significant of syncope; migraines; and HLD presenting with diarrhea, fever.  Last Thursday, she "started getting sick".  The doctor assumed it was sinusitis and called in a Z-pack - but she didn't start it until Wednesday.  Wednesday, she was not feeling any better.  She got "severely dehydrated since then".  She developed diarrhea.  Vomiting maybe once or twice, hasn't been able to eat.  Imodium would work until she tried to eat anything.  She wasn't drinking much.  She was having maybe 2-3 stools per day, last stool yesterday.  She came to the ER about 8pm last night.  She had a similar episode about 5 years ago.  She saw Clarise Cruz on Monday for her annual, convinced she does not have COVID.  She refused the vaccine - her dermatologist did not recommend it.   She also adamantly refused to be tested for COVID, but was ultimately offered the option of testing vs. Remaining a PUI on the COVID floor and agreed to be tested.    ED Course:  URI symptoms x 1 week with diarrhea.  Given Z-pack via televisit, taken 3/5 days.  Diarrhea, feels weak.  Creatinine up to 2.5, prior normal.  Likely needs overnight obs.  Stool studies sent, diarrhea before antibiotics.  COVID pending.   Subjective:    Kelli Richmond today generalized weakness, appetite has been improving, but  still poor, still reports diarrhea, denies any dyspnea, but she reports some cough.   Assessment  & Plan :    Principal Problem:   COVID-19 Active Problems:   Depression   Dyslipidemia  Acute infection with COVID-19  with COVID-19 pneumonia -Patient with presenting with URI symptoms and diarrhea with dehydration -COVID POSITIVE -Chest ray significant for bilateral opacities, as well her CRP is elevated at 20, which is very concerning, will continue with IV steroids, continue with IV remdesivir for treatment, procalcitonin low at 0.16, so so far I see no indication for antibiotics. -Was encouraged with incentive spirometry, flutter valve, to get out of bed to chair and to ambulate. -She still having some GI symptoms including diarrhea, she will be kept on IV fluids for hydration. -Continue to trend inflammatory markers closely   COVID-19 Labs  Recent Labs    10/14/19 1606 10/15/19 0201  DDIMER 1.16* 0.70*  FERRITIN 349* 434*  LDH 334*  --   CRP 19.4* 21.0*    Lab Results  Component Value Date   SARSCOV2NAA POSITIVE (A) 10/14/2019   Depression -Continue Cymbalta, Topamax  HLD -Continue Crestor  Hypokalemia -Repleted.  AKI -Continue elevated on admission 2.2, improving with IV hydration, avoid nephrotoxic medications.  Abnormal UA, she is denying any urinary symptoms.  Code Status : Full  Family Communication  : Daughter updated via phone  Disposition Plan  :  Status is: Inpatient  Remains inpatient appropriate because:IV treatments appropriate due to intensity of illness or inability to take PO   Dispo: The patient is from: Home              Anticipated d/c is to: Home              Anticipated d/c date is: 2 days              Patient currently is not medically stable to d/c.        Barriers For Discharge : Remdesivir and IV fluids, significantly elevated CRP  Consults  :  none  Procedures  : None  DVT Prophylaxis  : [place] lovenox  Lab Results    Component Value Date   PLT 196 10/15/2019    Antibiotics  :    Anti-infectives (From admission, onward)   Start     Dose/Rate Route Frequency Ordered Stop   10/15/19 1000  remdesivir 100 mg in sodium chloride 0.9 % 100 mL IVPB     100 mg 200 mL/hr over 30 Minutes Intravenous Daily 10/14/19 1437 10/19/19 0959   10/14/19 1445  remdesivir 200 mg in sodium chloride 0.9% 250 mL IVPB     200 mg 580 mL/hr over 30 Minutes Intravenous Once 10/14/19 1437 10/14/19 1717        Objective:   Vitals:   10/15/19 0026 10/15/19 0358 10/15/19 0651 10/15/19 0820  BP: 101/71 (!) 86/45 (!) 102/56 119/69  Pulse: 79 75 69 80  Resp: 18 18  18   Temp: 97.8 F (36.6 C) 97.9 F (36.6 C)  97.7 F (36.5 C)  TempSrc: Oral Oral  Oral  SpO2:  95% 91% 92%  Weight:      Height:        Wt Readings from Last 3 Encounters:  10/13/19 63 kg  05/14/17 59.9 kg  04/26/15 67.6 kg     Intake/Output Summary (Last 24 hours) at 10/15/2019 1029 Last data filed at 10/15/2019 0934 Gross per 24 hour  Intake 2461.1 ml  Output --  Net 2461.1 ml     Physical Exam  Awake Alert, Oriented X 3, No new F.N deficits, Normal affect Symmetrical Chest wall movement, Good air movement bilaterally, CTAB RRR,No Gallops,Rubs or new Murmurs, No Parasternal Heave +ve B.Sounds, Abd Soft, No  tenderness,  No rebound - guarding or rigidity. No Cyanosis, Clubbing or edema, No new Rash or bruise      Data Review:    CBC Recent Labs  Lab 10/13/19 2144 10/15/19 0201  WBC 5.6 4.6  HGB 13.6 12.4  HCT 39.6 36.9  PLT 192 196  MCV 95.0 96.9  MCH 32.6 32.5  MCHC 34.3 33.6  RDW 12.1 12.2  LYMPHSABS 0.6* 0.5*  MONOABS 0.3 0.2  EOSABS 0.0 0.0  BASOSABS 0.0 0.0    Chemistries  Recent Labs  Lab 10/13/19 2144 10/15/19 0201  NA 138 143  K 3.1* 3.6  CL 107 108  CO2 18* 20*  GLUCOSE 107* 134*  BUN 43* 20  CREATININE 2.42* 1.17*  CALCIUM 8.7* 8.3*  MG  --  1.8  AST 39 41  ALT 17 18  ALKPHOS 66 61  BILITOT 0.5 0.5    ------------------------------------------------------------------------------------------------------------------ No results for input(s): CHOL, HDL, LDLCALC, TRIG, CHOLHDL, LDLDIRECT in the last 72 hours.  Lab Results  Component Value Date   HGBA1C  07/15/2010    5.1 (NOTE)                                                                       According to the ADA Clinical Practice Recommendations for 2011, when HbA1c is used as a screening test:   >=6.5%   Diagnostic of Diabetes Mellitus           (if abnormal result  is confirmed)  5.7-6.4%   Increased risk of developing Diabetes Mellitus  References:Diagnosis and Classification of Diabetes Mellitus,Diabetes Care,2011,34(Suppl 1):S62-S69 and Standards of Medical Care in         Diabetes - 2011,Diabetes RAQT,6226,33  (Suppl 1):S11-S61.   ------------------------------------------------------------------------------------------------------------------ No results for input(s): TSH, T4TOTAL, T3FREE, THYROIDAB in the last 72 hours.  Invalid input(s): FREET3 ------------------------------------------------------------------------------------------------------------------ Recent Labs    10/14/19 1606 10/15/19 0201  FERRITIN 349* 434*    Coagulation profile No results for input(s): INR, PROTIME in the last 168 hours.  Recent Labs    10/14/19 1606 10/15/19 0201  DDIMER 1.16* 0.70*    Cardiac Enzymes No results for input(s): CKMB, TROPONINI, MYOGLOBIN in the last 168 hours.  Invalid input(s): CK ------------------------------------------------------------------------------------------------------------------ No results found for: BNP  Inpatient Medications  Scheduled Meds: . bismuth subsalicylate  30 mL Oral Once  . dexamethasone (DECADRON) injection  6 mg Intravenous Q24H  . DULoxetine  60 mg Oral Daily  . enoxaparin (LOVENOX) injection  30 mg Subcutaneous Q24H  . topiramate  250 mg Oral Daily   Continuous Infusions: .  lactated ringers 75 mL/hr at 10/14/19 1717  . remdesivir 100 mg in NS 100 mL     PRN Meds:.acetaminophen, albuterol, chlorpheniramine-HYDROcodone, guaiFENesin-dextromethorphan, ondansetron (ZOFRAN) IV, oxyCODONE  Micro Results Recent Results (from the past 240 hour(s))  SARS Coronavirus 2 by RT PCR (hospital order, performed in [facility] hospital lab) Nasopharyngeal Nasopharyngeal Swab     Status: Abnormal   Collection Time: 10/14/19  8:42 AM   Specimen: Nasopharyngeal Swab  Result Value Ref Range Status   SARS Coronavirus 2 POSITIVE (A) NEGATIVE Final    Comment: RESULT CALLED TO, READ BACK BY AND VERIFIED WITH: RN JAIME BLUE 1426 10/14/2019 KB (NOTE) SARS-CoV-2 target  nucleic acids are DETECTED SARS-CoV-2 RNA is generally detectable in upper respiratory specimens  during the acute phase of infection.  Positive results are indicative  of the presence of the identified virus, but do not rule out bacterial infection or co-infection with other pathogens not detected by the test.  Clinical correlation with patient history and  other diagnostic information is necessary to determine patient infection status.  The expected result is negative. Fact Sheet for Patients:   StrictlyIdeas.no  Fact Sheet for Healthcare Providers:   BankingDealers.co.za   This test is not yet approved or cleared by the [place] FDA and  has been authorized for detection and/or diagnosis of SARS-CoV-2 by FDA under an Emergency Use Authorization (EUA).  This EUA will remain in effect (meaning this test can be  used) for the duration of  the COVID-19 declaration under Section 564(b)(1) of the Act, 21 U.S.C. section 360-bbb-3(b)(1), unless the authorization is terminated or revoked sooner.     Radiology Reports DG CHEST PORT 1 VIEW  Result Date: 10/14/2019 CLINICAL DATA:  COVID positive with fever. EXAM: PORTABLE CHEST 1 VIEW COMPARISON:  July 14, 2010  FINDINGS: Mild infiltrates are seen within the bilateral lung bases and mid lung fields, right slightly greater than left. There is no evidence of a pleural effusion or pneumothorax. The heart size and mediastinal contours are within normal limits. The visualized skeletal structures are unremarkable. IMPRESSION: Mild bilateral infiltrates. Electronically Signed   By: Virgina Norfolk M.D.   On: 10/14/2019 15:43      Phillips Climes M.D on 10/15/2019 at 10:29 AM    [facility] -  Office  [phone]

## 2019-10-16 LAB — CBC WITH DIFFERENTIAL/PLATELET
Abs Immature Granulocytes: 0.05 10*3/uL (ref 0.00–0.07)
Basophils Absolute: 0 10*3/uL (ref 0.0–0.1)
Basophils Relative: 0 %
Eosinophils Absolute: 0 10*3/uL (ref 0.0–0.5)
Eosinophils Relative: 0 %
HCT: 33.8 % — ABNORMAL LOW (ref 36.0–46.0)
Hemoglobin: 11 g/dL — ABNORMAL LOW (ref 12.0–15.0)
Immature Granulocytes: 1 %
Lymphocytes Relative: 10 %
Lymphs Abs: 0.7 10*3/uL (ref 0.7–4.0)
MCH: 32.5 pg (ref 26.0–34.0)
MCHC: 32.5 g/dL (ref 30.0–36.0)
MCV: 100 fL (ref 80.0–100.0)
Monocytes Absolute: 0.5 10*3/uL (ref 0.1–1.0)
Monocytes Relative: 8 %
Neutro Abs: 5.1 10*3/uL (ref 1.7–7.7)
Neutrophils Relative %: 81 %
Platelets: 230 10*3/uL (ref 150–400)
RBC: 3.38 MIL/uL — ABNORMAL LOW (ref 3.87–5.11)
RDW: 12.6 % (ref 11.5–15.5)
WBC: 6.4 10*3/uL (ref 4.0–10.5)
nRBC: 0 % (ref 0.0–0.2)

## 2019-10-16 LAB — COMPREHENSIVE METABOLIC PANEL
ALT: 20 U/L (ref 0–44)
AST: 46 U/L — ABNORMAL HIGH (ref 15–41)
Albumin: 2.4 g/dL — ABNORMAL LOW (ref 3.5–5.0)
Alkaline Phosphatase: 58 U/L (ref 38–126)
Anion gap: 9 (ref 5–15)
BUN: 13 mg/dL (ref 8–23)
CO2: 22 mmol/L (ref 22–32)
Calcium: 8 mg/dL — ABNORMAL LOW (ref 8.9–10.3)
Chloride: 113 mmol/L — ABNORMAL HIGH (ref 98–111)
Creatinine, Ser: 0.84 mg/dL (ref 0.44–1.00)
GFR calc Af Amer: >60 mL/min (ref >60)
GFR calc non Af Amer: >60 mL/min (ref >60)
Glucose, Bld: 75 mg/dL (ref 70–99)
Potassium: 3 mmol/L — ABNORMAL LOW (ref 3.5–5.1)
Sodium: 144 mmol/L (ref 135–145)
Total Bilirubin: 0.2 mg/dL — ABNORMAL LOW (ref 0.3–1.2)
Total Protein: 5.7 g/dL — ABNORMAL LOW (ref 6.5–8.1)

## 2019-10-16 LAB — D-DIMER, QUANTITATIVE: D-Dimer, Quant: 0.99 ug/mL-FEU — ABNORMAL HIGH (ref 0.00–0.50)

## 2019-10-16 LAB — PROCALCITONIN: Procalcitonin: <0.1 ng/mL

## 2019-10-16 LAB — FERRITIN: Ferritin: 266 ng/mL (ref 11–307)

## 2019-10-16 LAB — MAGNESIUM: Magnesium: 1.7 mg/dL (ref 1.7–2.4)

## 2019-10-16 LAB — C-REACTIVE PROTEIN: CRP: 10.6 mg/dL — ABNORMAL HIGH (ref <1.0)

## 2019-10-16 LAB — PHOSPHORUS: Phosphorus: 1.7 mg/dL — ABNORMAL LOW (ref 2.5–4.6)

## 2019-10-16 MED ORDER — ROSUVASTATIN CALCIUM 5 MG PO TABS
10.0000 mg | ORAL_TABLET | Freq: Every day | ORAL | Status: DC
  Administered 2019-10-16 – 2019-10-17 (×2): 10 mg via ORAL
  Filled 2019-10-16 (×2): qty 2

## 2019-10-16 NOTE — Progress Notes (Signed)
PROGRESS NOTE                                                                                                                                                                                                             Patient Demographics:    Kelli Richmond, is a 64 y.o. female, DOB - 22-Feb-1956, IEP:[id number]  Admit date - 10/13/2019   Admitting Physician Karmen Bongo, MD  Outpatient Primary MD for the patient is Chesley Noon, MD  LOS - 2   Chief Complaint  Patient presents with  . Diarrhea  . Fever       Brief Narrative     Kelli Richmond is a 64 y.o. female with medical history significant of syncope; migraines; and HLD presenting with diarrhea, fever.  Last Thursday, she "started getting sick".  The doctor assumed it was sinusitis and called in a Z-pack - but she didn't start it until Wednesday.  Wednesday, she was not feeling any better.  She got "severely dehydrated since then".  She developed diarrhea.  Vomiting maybe once or twice, hasn't been able to eat.  Imodium would work until she tried to eat anything.  She wasn't drinking much.  She was having maybe 2-3 stools per day, last stool yesterday.  She came to the ER about 8pm last night.  She had a similar episode about 5 years ago.  She saw Kelli Richmond on Monday for her annual, convinced she does not have COVID.  She refused the vaccine - her dermatologist did not recommend it.   She also adamantly refused to be tested for COVID, but was ultimately offered the option of testing vs. Remaining a PUI on the COVID floor and agreed to be tested. ED Course:  URI symptoms x 1 week with diarrhea.  Given Z-pack via televisit, taken 3/5 days.  Diarrhea, feels weak.  Creatinine up to 2.5, prior normal.  Likely needs overnight obs.  Stool studies sent, diarrhea before antibiotics.  COVID pending.   Subjective:    Kelli Richmond today eating better, but she was able to ambulate in the hallway yesterday,  where she reports she has some dyspnea, reports diarrhea has improved after she got Imodium x3, still reports some cough, but it is improving as well, she denies any urinary symptoms, she did have some migraine yesterday.   Assessment  &  Plan :    Principal Problem:   COVID-19 Active Problems:   Depression   Dyslipidemia  Acute infection with COVID-19 with COVID-19 pneumonia -Patient with presenting with URI symptoms and diarrhea with dehydration -COVID POSITIVE -Chest ray significant for bilateral opacities, and she had significantly elevated inflammatory markers on admission including CRP of 21, for which she has been started on IV steroids and IV remdesivir, - procalcitonin low at 0.16, so so far I see no indication for antibiotics. -Was encouraged with incentive spirometry, flutter valve, to get out of bed to chair and to ambulate. -With significant presentation with GI symptoms on admission, mainly diarrhea, now it has resolved, and improved oral intake, so we will discontinue IV fluids. -Continue to trend inflammatory markers closely   COVID-19 Labs  Recent Labs    10/14/19 1606 10/15/19 0201  DDIMER 1.16* 0.70*  FERRITIN 349* 434*  LDH 334*  --   CRP 19.4* 21.0*    Lab Results  Component Value Date   SARSCOV2NAA POSITIVE (A) 10/14/2019   Depression -Continue Cymbalta, Topamax  HLD -Continue Crestor  Hypokalemia -Repleted.  Migraine -Continue with home medication Topamax.  AKI -Continue elevated on admission 2.2, improving with IV hydration, avoid nephrotoxic medications.  Repeat  labs today.  Abnormal UA, she is denying any urinary symptoms.  Code Status : Full  Family Communication  : Daughter updated via phone daily   Disposition Plan  :  Status is: Inpatient  Remains inpatient appropriate because:IV treatments appropriate due to intensity of illness or inability to take PO   Dispo: The patient is from: Home              Anticipated d/c is  to: Home              Anticipated d/c date is: 2 days              Patient currently is not medically stable to d/c.        Barriers For Discharge : Remdesivir and IV fluids, significantly elevated CRP  Consults  :  none  Procedures  : None  DVT Prophylaxis  : [place] lovenox  Lab Results  Component Value Date   PLT 196 10/15/2019    Antibiotics  :    Anti-infectives (From admission, onward)   Start     Dose/Rate Route Frequency Ordered Stop   10/15/19 1000  remdesivir 100 mg in sodium chloride 0.9 % 100 mL IVPB     100 mg 200 mL/hr over 30 Minutes Intravenous Daily 10/14/19 1437 10/19/19 0959   10/14/19 1445  remdesivir 200 mg in sodium chloride 0.9% 250 mL IVPB     200 mg 580 mL/hr over 30 Minutes Intravenous Once 10/14/19 1437 10/14/19 1717        Objective:   Vitals:   10/15/19 1632 10/15/19 1930 10/15/19 2020 10/16/19 0100  BP: 118/66     Pulse: 79  79 66  Resp: 19 18 18 18   Temp: 97.9 F (36.6 C)  97.8 F (36.6 C) 98 F (36.7 C)  TempSrc: Oral  Oral Oral  SpO2: 100%  98% 98%  Weight:      Height:        Wt Readings from Last 3 Encounters:  10/13/19 63 kg  05/14/17 59.9 kg  04/26/15 67.6 kg     Intake/Output Summary (Last 24 hours) at 10/16/2019 1035 Last data filed at 10/16/2019 0600 Gross per 24 hour  Intake 950.22 ml  Output --  Net 950.22 ml     Physical Exam  Awake Alert, Oriented X 3, No new F.N deficits, Normal affect Symmetrical Chest wall movement, Good air movement bilaterally, CTAB RRR,No Gallops,Rubs or new Murmurs, No Parasternal Heave +ve B.Sounds, Abd Soft, No tenderness, No rebound - guarding or rigidity. No Cyanosis, Clubbing or edema, No new Rash or bruise        Data Review:    CBC Recent Labs  Lab 10/13/19 2144 10/15/19 0201  WBC 5.6 4.6  HGB 13.6 12.4  HCT 39.6 36.9  PLT 192 196  MCV 95.0 96.9  MCH 32.6 32.5  MCHC 34.3 33.6  RDW 12.1 12.2  LYMPHSABS 0.6* 0.5*  MONOABS 0.3 0.2  EOSABS 0.0 0.0  BASOSABS  0.0 0.0    Chemistries  Recent Labs  Lab 10/13/19 2144 10/15/19 0201  NA 138 143  K 3.1* 3.6  CL 107 108  CO2 18* 20*  GLUCOSE 107* 134*  BUN 43* 20  CREATININE 2.42* 1.17*  CALCIUM 8.7* 8.3*  MG  --  1.8  AST 39 41  ALT 17 18  ALKPHOS 66 61  BILITOT 0.5 0.5   ------------------------------------------------------------------------------------------------------------------ No results for input(s): CHOL, HDL, LDLCALC, TRIG, CHOLHDL, LDLDIRECT in the last 72 hours.  Lab Results  Component Value Date   HGBA1C  07/15/2010    5.1 (NOTE)                                                                       According to the ADA Clinical Practice Recommendations for 2011, when HbA1c is used as a screening test:   >=6.5%   Diagnostic of Diabetes Mellitus           (if abnormal result  is confirmed)  5.7-6.4%   Increased risk of developing Diabetes Mellitus  References:Diagnosis and Classification of Diabetes Mellitus,Diabetes Care,2011,34(Suppl 1):S62-S69 and Standards of Medical Care in         Diabetes - 2011,Diabetes GDJM,4268,34  (Suppl 1):S11-S61.   ------------------------------------------------------------------------------------------------------------------ No results for input(s): TSH, T4TOTAL, T3FREE, THYROIDAB in the last 72 hours.  Invalid input(s): FREET3 ------------------------------------------------------------------------------------------------------------------ Recent Labs    10/14/19 1606 10/15/19 0201  FERRITIN 349* 434*    Coagulation profile No results for input(s): INR, PROTIME in the last 168 hours.  Recent Labs    10/14/19 1606 10/15/19 0201  DDIMER 1.16* 0.70*    Cardiac Enzymes No results for input(s): CKMB, TROPONINI, MYOGLOBIN in the last 168 hours.  Invalid input(s): CK ------------------------------------------------------------------------------------------------------------------ No results found for: BNP  Inpatient  Medications  Scheduled Meds: . bismuth subsalicylate  30 mL Oral Once  . dexamethasone (DECADRON) injection  6 mg Intravenous Q24H  . DULoxetine  60 mg Oral Daily  . enoxaparin (LOVENOX) injection  40 mg Subcutaneous Daily  . famotidine  20 mg Oral Daily  . topiramate  250 mg Oral Daily   Continuous Infusions: . dextrose 5 % and 0.9 % NaCl with KCl 20 mEq/L 50 mL/hr at 10/16/19 0600  . remdesivir 100 mg in NS 100 mL 100 mg (10/16/19 0939)   PRN Meds:.acetaminophen, albuterol, chlorpheniramine-HYDROcodone, guaiFENesin-dextromethorphan, loperamide, ondansetron (ZOFRAN) IV, oxyCODONE, SUMAtriptan  Micro Results Recent Results (from the past 240 hour(s))  SARS Coronavirus 2 by RT PCR (hospital order,  performed in [facility] hospital lab) Nasopharyngeal Nasopharyngeal Swab     Status: Abnormal   Collection Time: 10/14/19  8:42 AM   Specimen: Nasopharyngeal Swab  Result Value Ref Range Status   SARS Coronavirus 2 POSITIVE (A) NEGATIVE Final    Comment: RESULT CALLED TO, READ BACK BY AND VERIFIED WITH: RN JAIME BLUE 0174 10/14/2019 KB (NOTE) SARS-CoV-2 target nucleic acids are DETECTED SARS-CoV-2 RNA is generally detectable in upper respiratory specimens  during the acute phase of infection.  Positive results are indicative  of the presence of the identified virus, but do not rule out bacterial infection or co-infection with other pathogens not detected by the test.  Clinical correlation with patient history and  other diagnostic information is necessary to determine patient infection status.  The expected result is negative. Fact Sheet for Patients:   StrictlyIdeas.no  Fact Sheet for Healthcare Providers:   BankingDealers.co.za   This test is not yet approved or cleared by the [place] FDA and  has been authorized for detection and/or diagnosis of SARS-CoV-2 by FDA under an Emergency Use Authorization (EUA).  This EUA will remain  in effect (meaning this test can be  used) for the duration of  the COVID-19 declaration under Section 564(b)(1) of the Act, 21 U.S.C. section 360-bbb-3(b)(1), unless the authorization is terminated or revoked sooner.     Radiology Reports DG CHEST PORT 1 VIEW  Result Date: 10/14/2019 CLINICAL DATA:  COVID positive with fever. EXAM: PORTABLE CHEST 1 VIEW COMPARISON:  July 14, 2010 FINDINGS: Mild infiltrates are seen within the bilateral lung bases and mid lung fields, right slightly greater than left. There is no evidence of a pleural effusion or pneumothorax. The heart size and mediastinal contours are within normal limits. The visualized skeletal structures are unremarkable. IMPRESSION: Mild bilateral infiltrates. Electronically Signed   By: Virgina Norfolk M.D.   On: 10/14/2019 15:43      Phillips Climes M.D on 10/16/2019 at 10:35 AM    [facility] -  Office  [phone]

## 2019-10-17 LAB — COMPREHENSIVE METABOLIC PANEL
ALT: 30 U/L (ref 0–44)
AST: 61 U/L — ABNORMAL HIGH (ref 15–41)
Albumin: 2.4 g/dL — ABNORMAL LOW (ref 3.5–5.0)
Alkaline Phosphatase: 88 U/L (ref 38–126)
Anion gap: 10 (ref 5–15)
BUN: 11 mg/dL (ref 8–23)
CO2: 21 mmol/L — ABNORMAL LOW (ref 22–32)
Calcium: 8.1 mg/dL — ABNORMAL LOW (ref 8.9–10.3)
Chloride: 111 mmol/L (ref 98–111)
Creatinine, Ser: 0.72 mg/dL (ref 0.44–1.00)
GFR calc Af Amer: >60 mL/min (ref >60)
GFR calc non Af Amer: >60 mL/min (ref >60)
Glucose, Bld: 105 mg/dL — ABNORMAL HIGH (ref 70–99)
Potassium: 3.8 mmol/L (ref 3.5–5.1)
Sodium: 142 mmol/L (ref 135–145)
Total Bilirubin: 0.3 mg/dL (ref 0.3–1.2)
Total Protein: 5.5 g/dL — ABNORMAL LOW (ref 6.5–8.1)

## 2019-10-17 LAB — CBC WITH DIFFERENTIAL/PLATELET
Abs Immature Granulocytes: 0.06 10*3/uL (ref 0.00–0.07)
Basophils Absolute: 0 10*3/uL (ref 0.0–0.1)
Basophils Relative: 0 %
Eosinophils Absolute: 0 10*3/uL (ref 0.0–0.5)
Eosinophils Relative: 0 %
HCT: 34.4 % — ABNORMAL LOW (ref 36.0–46.0)
Hemoglobin: 11.4 g/dL — ABNORMAL LOW (ref 12.0–15.0)
Immature Granulocytes: 1 %
Lymphocytes Relative: 10 %
Lymphs Abs: 0.5 10*3/uL — ABNORMAL LOW (ref 0.7–4.0)
MCH: 32.5 pg (ref 26.0–34.0)
MCHC: 33.1 g/dL (ref 30.0–36.0)
MCV: 98 fL (ref 80.0–100.0)
Monocytes Absolute: 0.5 10*3/uL (ref 0.1–1.0)
Monocytes Relative: 9 %
Neutro Abs: 4.4 10*3/uL (ref 1.7–7.7)
Neutrophils Relative %: 80 %
Platelets: 217 10*3/uL (ref 150–400)
RBC: 3.51 MIL/uL — ABNORMAL LOW (ref 3.87–5.11)
RDW: 12.3 % (ref 11.5–15.5)
WBC: 5.5 10*3/uL (ref 4.0–10.5)
nRBC: 0 % (ref 0.0–0.2)

## 2019-10-17 LAB — C-REACTIVE PROTEIN: CRP: 9.2 mg/dL — ABNORMAL HIGH (ref <1.0)

## 2019-10-17 LAB — PHOSPHORUS: Phosphorus: 3.4 mg/dL (ref 2.5–4.6)

## 2019-10-17 LAB — D-DIMER, QUANTITATIVE: D-Dimer, Quant: 0.8 ug/mL-FEU — ABNORMAL HIGH (ref 0.00–0.50)

## 2019-10-17 LAB — PROCALCITONIN: Procalcitonin: <0.1 ng/mL

## 2019-10-17 LAB — FERRITIN: Ferritin: 237 ng/mL (ref 11–307)

## 2019-10-17 LAB — MAGNESIUM: Magnesium: 1.8 mg/dL (ref 1.7–2.4)

## 2019-10-17 NOTE — Progress Notes (Signed)
PROGRESS NOTE                                                                                                                                                                                                             Patient Demographics:    Kelli Richmond, is a 64 y.o. female, DOB - 25-Aug-1955, YIF:[id number]  Admit date - 10/13/2019   Admitting Physician Karmen Bongo, MD  Outpatient Primary MD for the patient is Chesley Noon, MD  LOS - 3   Chief Complaint  Patient presents with  . Diarrhea  . Fever       Brief Narrative     Kelli Richmond is a 64 y.o. female with medical history significant of syncope; migraines; and HLD presenting with diarrhea, fever.  Last Thursday, she "started getting sick".  The doctor assumed it was sinusitis and called in a Z-pack - but she didn't start it until Wednesday.  Wednesday, she was not feeling any better.  She got "severely dehydrated since then".  She developed diarrhea.  Vomiting maybe once or twice, hasn't been able to eat.  Imodium would work until she tried to eat anything.  She wasn't drinking much.  She was having maybe 2-3 stools per day, last stool yesterday.  She came to the ER about 8pm last night.  She had a similar episode about 5 years ago.  She saw Clarise Cruz on Monday for her annual, convinced she does not have COVID.  She refused the vaccine - her dermatologist did not recommend it.   She also adamantly refused to be tested for COVID, but was ultimately offered the option of testing vs. Remaining a PUI on the COVID floor and agreed to be tested. -Patient has no hypoxia, but significant elevated CRP of 21, and Covid finding on imaging, so she was admitted for steroids and remdesivir treatment.   Subjective:    Kelli Richmond today reports she is feeling better, had some headache yesterday, but otherwise diarrhea has resolved, denies any dyspnea or cough.   Assessment  & Plan :    Principal  Problem:   COVID-19 Active Problems:   Depression   Dyslipidemia  Acute infection with COVID-19 with COVID-19 pneumonia -Patient with presenting with URI symptoms and diarrhea with dehydration -COVID POSITIVE -Chest ray significant for bilateral opacities, and she had significantly  elevated inflammatory markers on admission including CRP of 21, for which she has been started on IV steroids and IV remdesivir, - procalcitonin low at 0.16, so so far I see no indication for antibiotics. -Was encouraged with incentive spirometry, flutter valve, to get out of bed to chair and to ambulate. -With significant presentation with GI symptoms on admission, mainly diarrhea, now it has resolved, and improved oral intake, required significant IV fluid initially, has been stopped for last 24 hours. -Continue to trend inflammatory markers closely   COVID-19 Labs  Recent Labs    10/14/19 1606 10/14/19 1606 10/15/19 0201 10/16/19 1111 10/17/19 0431  DDIMER 1.16*   < > 0.70* 0.99* 0.80*  FERRITIN 349*   < > 434* 266 237  LDH 334*  --   --   --   --   CRP 19.4*   < > 21.0* 10.6* 9.2*   < > = values in this interval not displayed.    Lab Results  Component Value Date   SARSCOV2NAA POSITIVE (A) 10/14/2019   Depression -Continue Cymbalta, Topamax  HLD -Continue Crestor  Hypokalemia/hypophosphatemia -Repleted.  Migraine -Continue with home medication Topamax.  AKI -Continue elevated on admission 2.2, resolved with IV hydration, avoid nephrotoxic medications.    Abnormal UA, she is denying any urinary symptoms.  Code Status : Full  Family Communication  : Daughter updated via phone daily   Disposition Plan  :  Status is: Inpatient  Remains inpatient appropriate because:IV treatments appropriate due to intensity of illness or inability to take PO   Dispo: The patient is from: Home              Anticipated d/c is to: Home              Anticipated d/c date is: 1 day               Patient currently is not medically stable to d/c.  Plan to go home tomorrow after finishing IV remdesivir        Barriers For Discharge : Remdesivir and IV fluids, significantly elevated CRP  Consults  :  none  Procedures  : None  DVT Prophylaxis  : [place] lovenox  Lab Results  Component Value Date   PLT 217 10/17/2019    Antibiotics  :    Anti-infectives (From admission, onward)   Start     Dose/Rate Route Frequency Ordered Stop   10/15/19 1000  remdesivir 100 mg in sodium chloride 0.9 % 100 mL IVPB     100 mg 200 mL/hr over 30 Minutes Intravenous Daily 10/14/19 1437 10/19/19 0959   10/14/19 1445  remdesivir 200 mg in sodium chloride 0.9% 250 mL IVPB     200 mg 580 mL/hr over 30 Minutes Intravenous Once 10/14/19 1437 10/14/19 1717        Objective:   Vitals:   10/16/19 1736 10/16/19 2005 10/16/19 2132 10/17/19 0505  BP: 108/63  113/67 123/64  Pulse: 66  73 60  Resp: 18 20 19 19   Temp: 98.2 F (36.8 C)  98.3 F (36.8 C) 98.1 F (36.7 C)  TempSrc: Oral  Oral Oral  SpO2: 99%  96% 98%  Weight:      Height:        Wt Readings from Last 3 Encounters:  10/13/19 63 kg  05/14/17 59.9 kg  04/26/15 67.6 kg     Intake/Output Summary (Last 24 hours) at 10/17/2019 1003 Last data filed at 10/16/2019 1840 Gross per  24 hour  Intake 590 ml  Output --  Net 590 ml     Physical Exam  Awake Alert, Oriented X 3, No new F.N deficits, Normal affect Symmetrical Chest wall movement, Good air movement bilaterally, CTAB RRR,No Gallops,Rubs or new Murmurs, No Parasternal Heave +ve B.Sounds, Abd Soft, No tenderness, No rebound - guarding or rigidity. No Cyanosis, Clubbing or edema, No new Rash or bruise      Data Review:    CBC Recent Labs  Lab 10/13/19 2144 10/15/19 0201 10/16/19 1111 10/17/19 0431  WBC 5.6 4.6 6.4 5.5  HGB 13.6 12.4 11.0* 11.4*  HCT 39.6 36.9 33.8* 34.4*  PLT 192 196 230 217  MCV 95.0 96.9 100.0 98.0  MCH 32.6 32.5 32.5 32.5  MCHC 34.3 33.6  32.5 33.1  RDW 12.1 12.2 12.6 12.3  LYMPHSABS 0.6* 0.5* 0.7 0.5*  MONOABS 0.3 0.2 0.5 0.5  EOSABS 0.0 0.0 0.0 0.0  BASOSABS 0.0 0.0 0.0 0.0    Chemistries  Recent Labs  Lab 10/13/19 2144 10/15/19 0201 10/16/19 1111 10/17/19 0431  NA 138 143 144 142  K 3.1* 3.6 3.0* 3.8  CL 107 108 113* 111  CO2 18* 20* 22 21*  GLUCOSE 107* 134* 75 105*  BUN 43* 20 13 11   CREATININE 2.42* 1.17* 0.84 0.72  CALCIUM 8.7* 8.3* 8.0* 8.1*  MG  --  1.8 1.7 1.8  AST 39 41 46* 61*  ALT 17 18 20 30   ALKPHOS 66 61 58 88  BILITOT 0.5 0.5 0.2* 0.3   ------------------------------------------------------------------------------------------------------------------ No results for input(s): CHOL, HDL, LDLCALC, TRIG, CHOLHDL, LDLDIRECT in the last 72 hours.  Lab Results  Component Value Date   HGBA1C  07/15/2010    5.1 (NOTE)                                                                       According to the ADA Clinical Practice Recommendations for 2011, when HbA1c is used as a screening test:   >=6.5%   Diagnostic of Diabetes Mellitus           (if abnormal result  is confirmed)  5.7-6.4%   Increased risk of developing Diabetes Mellitus  References:Diagnosis and Classification of Diabetes Mellitus,Diabetes Care,2011,34(Suppl 1):S62-S69 and Standards of Medical Care in         Diabetes - 2011,Diabetes GYJE,5631,49  (Suppl 1):S11-S61.   ------------------------------------------------------------------------------------------------------------------ No results for input(s): TSH, T4TOTAL, T3FREE, THYROIDAB in the last 72 hours.  Invalid input(s): FREET3 ------------------------------------------------------------------------------------------------------------------ Recent Labs    10/16/19 1111 10/17/19 0431  FERRITIN 266 237    Coagulation profile No results for input(s): INR, PROTIME in the last 168 hours.  Recent Labs    10/16/19 1111 10/17/19 0431  DDIMER 0.99* 0.80*    Cardiac  Enzymes No results for input(s): CKMB, TROPONINI, MYOGLOBIN in the last 168 hours.  Invalid input(s): CK ------------------------------------------------------------------------------------------------------------------ No results found for: BNP  Inpatient Medications  Scheduled Meds: . dexamethasone (DECADRON) injection  6 mg Intravenous Q24H  . DULoxetine  60 mg Oral Daily  . enoxaparin (LOVENOX) injection  40 mg Subcutaneous Daily  . famotidine  20 mg Oral Daily  . rosuvastatin  10 mg Oral q1800  . topiramate  250 mg Oral Daily  Continuous Infusions: . dextrose 5 % and 0.9 % NaCl with KCl 20 mEq/L Stopped (10/16/19 1300)  . remdesivir 100 mg in NS 100 mL 100 mg (10/17/19 0932)   PRN Meds:.acetaminophen, albuterol, chlorpheniramine-HYDROcodone, guaiFENesin-dextromethorphan, loperamide, ondansetron (ZOFRAN) IV, oxyCODONE, SUMAtriptan  Micro Results Recent Results (from the past 240 hour(s))  SARS Coronavirus 2 by RT PCR (hospital order, performed in [place] hospital lab) Nasopharyngeal Nasopharyngeal Swab     Status: Abnormal   Collection Time: 10/14/19  8:42 AM   Specimen: Nasopharyngeal Swab  Result Value Ref Range Status   SARS Coronavirus 2 POSITIVE (A) NEGATIVE Final    Comment: RESULT CALLED TO, READ BACK BY AND VERIFIED WITH: RN JAIME BLUE [id number] 10/14/2019 KB (NOTE) SARS-CoV-2 target nucleic acids are DETECTED SARS-CoV-2 RNA is generally detectable in upper respiratory specimens  during the acute phase of infection.  Positive results are indicative  of the presence of the identified virus, but do not rule out bacterial infection or co-infection with other pathogens not detected by the test.  Clinical correlation with patient history and  other diagnostic information is necessary to determine patient infection status.  The expected result is negative. Fact Sheet for Patients:   StrictlyIdeas.no  Fact Sheet for Healthcare Providers:    BankingDealers.co.za   This test is not yet approved or cleared by the [place] FDA and  has been authorized for detection and/or diagnosis of SARS-CoV-2 by FDA under an Emergency Use Authorization (EUA).  This EUA will remain in effect (meaning this test can be  used) for the duration of  the COVID-19 declaration under Section 564(b)(1) of the Act, 21 U.S.C. section 360-bbb-3(b)(1), unless the authorization is terminated or revoked sooner.     Radiology Reports DG CHEST PORT 1 VIEW  Result Date: 10/14/2019 CLINICAL DATA:  COVID positive with fever. EXAM: PORTABLE CHEST 1 VIEW COMPARISON:  July 14, 2010 FINDINGS: Mild infiltrates are seen within the bilateral lung bases and mid lung fields, right slightly greater than left. There is no evidence of a pleural effusion or pneumothorax. The heart size and mediastinal contours are within normal limits. The visualized skeletal structures are unremarkable. IMPRESSION: Mild bilateral infiltrates. Electronically Signed   By: Virgina Norfolk M.D.   On: 10/14/2019 15:43      Phillips Climes M.D on 10/17/2019 at 10:03 AM    [facility] -  Office  [phone]

## 2019-10-18 DIAGNOSIS — R197 Diarrhea, unspecified: Secondary | ICD-10-CM

## 2019-10-18 LAB — CBC WITH DIFFERENTIAL/PLATELET
Abs Immature Granulocytes: 0.11 10*3/uL — ABNORMAL HIGH (ref 0.00–0.07)
Basophils Absolute: 0 10*3/uL (ref 0.0–0.1)
Basophils Relative: 0 %
Eosinophils Absolute: 0 10*3/uL (ref 0.0–0.5)
Eosinophils Relative: 0 %
HCT: 33.4 % — ABNORMAL LOW (ref 36.0–46.0)
Hemoglobin: 11.1 g/dL — ABNORMAL LOW (ref 12.0–15.0)
Immature Granulocytes: 2 %
Lymphocytes Relative: 10 %
Lymphs Abs: 0.6 10*3/uL — ABNORMAL LOW (ref 0.7–4.0)
MCH: 32.4 pg (ref 26.0–34.0)
MCHC: 33.2 g/dL (ref 30.0–36.0)
MCV: 97.4 fL (ref 80.0–100.0)
Monocytes Absolute: 0.6 10*3/uL (ref 0.1–1.0)
Monocytes Relative: 9 %
Neutro Abs: 5.1 10*3/uL (ref 1.7–7.7)
Neutrophils Relative %: 79 %
Platelets: 262 10*3/uL (ref 150–400)
RBC: 3.43 MIL/uL — ABNORMAL LOW (ref 3.87–5.11)
RDW: 12.2 % (ref 11.5–15.5)
WBC: 6.4 10*3/uL (ref 4.0–10.5)
nRBC: 0 % (ref 0.0–0.2)

## 2019-10-18 LAB — COMPREHENSIVE METABOLIC PANEL
ALT: 66 U/L — ABNORMAL HIGH (ref 0–44)
AST: 99 U/L — ABNORMAL HIGH (ref 15–41)
Albumin: 2.4 g/dL — ABNORMAL LOW (ref 3.5–5.0)
Alkaline Phosphatase: 99 U/L (ref 38–126)
Anion gap: 8 (ref 5–15)
BUN: 11 mg/dL (ref 8–23)
CO2: 20 mmol/L — ABNORMAL LOW (ref 22–32)
Calcium: 7.8 mg/dL — ABNORMAL LOW (ref 8.9–10.3)
Chloride: 111 mmol/L (ref 98–111)
Creatinine, Ser: 0.75 mg/dL (ref 0.44–1.00)
GFR calc Af Amer: >60 mL/min (ref >60)
GFR calc non Af Amer: >60 mL/min (ref >60)
Glucose, Bld: 112 mg/dL — ABNORMAL HIGH (ref 70–99)
Potassium: 3.5 mmol/L (ref 3.5–5.1)
Sodium: 139 mmol/L (ref 135–145)
Total Bilirubin: 0.7 mg/dL (ref 0.3–1.2)
Total Protein: 5.3 g/dL — ABNORMAL LOW (ref 6.5–8.1)

## 2019-10-18 LAB — C-REACTIVE PROTEIN: CRP: 8.4 mg/dL — ABNORMAL HIGH (ref <1.0)

## 2019-10-18 LAB — FERRITIN: Ferritin: 233 ng/mL (ref 11–307)

## 2019-10-18 LAB — D-DIMER, QUANTITATIVE: D-Dimer, Quant: 0.86 ug/mL-FEU — ABNORMAL HIGH (ref 0.00–0.50)

## 2019-10-18 LAB — MAGNESIUM: Magnesium: 1.7 mg/dL (ref 1.7–2.4)

## 2019-10-18 LAB — PHOSPHORUS: Phosphorus: 3.4 mg/dL (ref 2.5–4.6)

## 2019-10-18 MED ORDER — MAGNESIUM SULFATE 2 GM/50ML IV SOLN
2.0000 g | Freq: Once | INTRAVENOUS | Status: AC
  Administered 2019-10-18: 2 g via INTRAVENOUS
  Filled 2019-10-18: qty 50

## 2019-10-18 MED ORDER — DEXAMETHASONE 6 MG PO TABS
6.0000 mg | ORAL_TABLET | Freq: Every day | ORAL | 0 refills | Status: DC
Start: 2019-10-18 — End: 2022-06-16

## 2019-10-18 MED ORDER — SODIUM CHLORIDE 0.9% FLUSH
3.0000 mL | Freq: Two times a day (BID) | INTRAVENOUS | Status: DC
  Administered 2019-10-18: 3 mL via INTRAVENOUS

## 2019-10-18 MED ORDER — POTASSIUM CHLORIDE CRYS ER 20 MEQ PO TBCR
20.0000 meq | EXTENDED_RELEASE_TABLET | Freq: Once | ORAL | Status: AC
  Administered 2019-10-18: 20 meq via ORAL
  Filled 2019-10-18: qty 1

## 2019-10-18 MED ORDER — SODIUM CHLORIDE 0.9% FLUSH: 3.0000 mL | INTRAVENOUS | Status: DC | PRN

## 2019-10-18 MED ORDER — SODIUM CHLORIDE 0.9 % IV SOLN: 250.0000 mL | INTRAVENOUS | Status: DC | PRN

## 2019-10-18 NOTE — Discharge Summary (Signed)
PATIENT DETAILS Name: Kelli Richmond Age: 64 y.o. Sex: female Date of Birth: 11/09/1955 MRN: [id number]. Admitting Physician: Karmen Bongo, MD CHY:IFOYDX, Rebeca Alert, MD  Admit Date: 10/13/2019 Discharge date: 10/18/2019  Recommendations for Outpatient Follow-up:  1. Follow up with PCP in 1-2 weeks 2. Please obtain CMP/CBC in one week 3. Repeat Chest Xray in 4-6 week  Admitted From:  Home  Disposition: [facility]: No  Equipment/Devices: None  Discharge Condition: Stable  CODE STATUS: FULL CODE  Diet recommendation:  Diet Order            Diet general        Diet regular Room service appropriate? Yes; Fluid consistency: Thin  Diet effective now               Brief Summary: See H&P, Labs, Consult and Test reports for all details in brief, patient is a 64 year old female with migraines, HLD, syncope who apparently was feeling "sick" for approximately 2 weeks-she done started having diarrhea and fever few days prior to this hospitalization-upon further evaluation she was noted to have AKI-chest x-ray was positive for pneumonia.  She was subsequently admitted to the hospitalist service.  Brief Hospital Course: COVID-19 pneumonia with gastroenteritis: GI symptoms have completely resolved-she is not hypoxic-CRP is downtrending.  Has completed a course of remdesivir-we will continue on steroids for a few more days.  Stable for discharge today.  COVID-19 Labs:  Recent Labs    10/16/19 1111 10/17/19 0431 10/18/19 0228  DDIMER 0.99* 0.80* 0.86*  FERRITIN 266 237 233  CRP 10.6* 9.2* 8.4*    Lab Results  Component Value Date   SARSCOV2NAA POSITIVE (A) 10/14/2019    AKI: Likely hemodynamically mediated in setting of GI loss-resolved.  Hypokalemia/hypophosphatemia: Second to GI loss-repleted.  Depression: Stable-continue Cymbalta/Topamax.  HLD: Continue Crestor  Migraine headaches: Complains of pain in her neck this morning-thinks it is from the  hospital bed-feels that once she gets home-she will feel better.  Resume Topamax.  Follow with headache/migraine clinic as usual.  Procedures/Studies: None  Discharge Diagnoses:  Principal Problem:   COVID-19 Active Problems:   Depression   Dyslipidemia   Discharge Instructions:    Person Under Monitoring Name: Kelli Richmond  Location: [place] [address]   Infection Prevention Recommendations for Individuals Confirmed to have, or Being Evaluated for, 2019 Novel Coronavirus (COVID-19) Infection Who Receive Care at Home  Individuals who are confirmed to have, or are being evaluated for, COVID-19 should follow the prevention steps below until a healthcare provider or local or state health department says they can return to normal activities.  Stay home except to get medical care You should restrict activities outside your home, except for getting medical care. Do not go to work, school, or public areas, and do not use public transportation or taxis.  Call ahead before visiting your doctor Before your medical appointment, call the healthcare provider and tell them that you have, or are being evaluated for, COVID-19 infection. This will help the healthcare provider's office take steps to keep other people from getting infected. Ask your healthcare provider to call the local or state health department.  Monitor your symptoms Seek prompt medical attention if your illness is worsening (e.g., difficulty breathing). Before going to your medical appointment, call the healthcare provider and tell them that you have, or are being evaluated for, COVID-19 infection. Ask your healthcare provider to call the local or state health department.  Wear a facemask You should wear a facemask that covers your nose and mouth when you are in the same room with other people and when you visit a healthcare provider. People who live with or visit you should also wear a  facemask while they are in the same room with you.  Separate yourself from other people in your home As much as possible, you should stay in a different room from other people in your home. Also, you should use a separate bathroom, if available.  Avoid sharing household items You should not share dishes, drinking glasses, cups, eating utensils, towels, bedding, or other items with other people in your home. After using these items, you should wash them thoroughly with soap and water.  Cover your coughs and sneezes Cover your mouth and nose with a tissue when you cough or sneeze, or you can cough or sneeze into your sleeve. Throw used tissues in a lined trash can, and immediately wash your hands with soap and water for at least 20 seconds or use an alcohol-based hand rub.  Wash your [facility] your hands often and thoroughly with soap and water for at least 20 seconds. You can use an alcohol-based hand sanitizer if soap and water are not available and if your hands are not visibly dirty. Avoid touching your eyes, nose, and mouth with unwashed hands.   Prevention Steps for Caregivers and Household Members of Individuals Confirmed to have, or Being Evaluated for, COVID-19 Infection Being Cared for in the Home  If you live with, or provide care at home for, a person confirmed to have, or being evaluated for, COVID-19 infection please follow these guidelines to prevent infection:  Follow healthcare provider's instructions Make sure that you understand and can help the patient follow any healthcare provider instructions for all care.  Provide for the patient's basic needs You should help the patient with basic needs in the home and provide support for getting groceries, prescriptions, and other personal needs.  Monitor the patient's symptoms If they are getting sicker, call his or her medical provider and tell them that the patient has, or is being evaluated for, COVID-19 infection.  This will help the healthcare provider's office take steps to keep other people from getting infected. Ask the healthcare provider to call the local or state health department.  Limit the number of people who have contact with the patient  If possible, have only one caregiver for the patient.  Other household members should stay in another home or place of residence. If this is not possible, they should stay  in another room, or be separated from the patient as much as possible. Use a separate bathroom, if available.  Restrict visitors who do not have an essential need to be in the home.  Keep older adults, very young children, and other sick people away from the patient Keep older adults, very young children, and those who have compromised immune systems or chronic health conditions away from the patient. This includes people with chronic heart, lung, or kidney conditions, diabetes, and cancer.  Ensure good ventilation Make sure that shared spaces in the home have good air flow, such as from an air conditioner or an opened window, weather permitting.  Wash your hands often  Wash your hands often and thoroughly with soap and water for at least 20 seconds. You can use an alcohol based hand sanitizer if soap and water are not available and if your hands are not visibly dirty.  Avoid touching your eyes, nose, and mouth with unwashed hands.  Use disposable paper towels to dry your hands. If not available, use dedicated cloth towels and replace them when they become wet.  Wear a facemask and gloves  Wear a disposable facemask at all times in the room and gloves when you touch or have contact with the patient's blood, body fluids, and/or secretions or excretions, such as sweat, saliva, sputum, nasal mucus, vomit, urine, or feces.  Ensure the mask fits over your nose and mouth tightly, and do not touch it during use.  Throw out disposable facemasks and gloves after using them. Do not  reuse.  Wash your hands immediately after removing your facemask and gloves.  If your personal clothing becomes contaminated, carefully remove clothing and launder. Wash your hands after handling contaminated clothing.  Place all used disposable facemasks, gloves, and other waste in a lined container before disposing them with other household waste.  Remove gloves and wash your hands immediately after handling these items.  Do not share dishes, glasses, or other household items with the patient  Avoid sharing household items. You should not share dishes, drinking glasses, cups, eating utensils, towels, bedding, or other items with a patient who is confirmed to have, or being evaluated for, COVID-19 infection.  After the person uses these items, you should wash them thoroughly with soap and water.  Wash laundry thoroughly  Immediately remove and wash clothes or bedding that have blood, body fluids, and/or secretions or excretions, such as sweat, saliva, sputum, nasal mucus, vomit, urine, or feces, on them.  Wear gloves when handling laundry from the patient.  Read and follow directions on labels of laundry or clothing items and detergent. In general, wash and dry with the warmest temperatures recommended on the label.  Clean all areas the individual has used often  Clean all touchable surfaces, such as counters, tabletops, doorknobs, bathroom fixtures, toilets, phones, keyboards, tablets, and bedside tables, every day. Also, clean any surfaces that may have blood, body fluids, and/or secretions or excretions on them.  Wear gloves when cleaning surfaces the patient has come in contact with.  Use a diluted bleach solution (e.g., dilute bleach with 1 part bleach and 10 parts water) or a household disinfectant with a label that says EPA-registered for coronaviruses. To make a bleach solution at home, add 1 tablespoon of bleach to 1 quart (4 cups) of water. For a larger supply, add  cup of  bleach to 1 gallon (16 cups) of water.  Read labels of cleaning products and follow recommendations provided on product labels. Labels contain instructions for safe and effective use of the cleaning product including precautions you should take when applying the product, such as wearing gloves or eye protection and making sure you have good ventilation during use of the product.  Remove gloves and wash hands immediately after cleaning.  Monitor yourself for signs and symptoms of illness Caregivers and household members are considered close contacts, should monitor their health, and will be asked to limit movement outside of the home to the extent possible. Follow the monitoring steps for close contacts listed on the symptom monitoring form.   ? If you have additional questions, contact your local health department or call the epidemiologist on call at [phone] (available 24/7). ? This guidance is subject to change. For the most up-to-date guidance from [facility], please refer to their website: YouBlogs.pl    Activity:  As tolerated  Discharge Instructions    Call MD  for:  difficulty breathing, headache or visual disturbances   Complete by: As directed    Diet general   Complete by: As directed    Discharge instructions   Complete by: As directed    21 days of isolation from the day of your first positive test or from first day of your symptoms.   Increase activity slowly   Complete by: As directed      Allergies as of 10/18/2019   No Known Allergies     Medication List    TAKE these medications   dexamethasone 6 MG tablet Commonly known as: DECADRON Take 1 tablet (6 mg total) by mouth daily.   DULoxetine 60 MG capsule Commonly known as: CYMBALTA Take 60 mg by mouth daily.   estradiol 0.05 MG/24HR patch Commonly known as: VIVELLE-DOT Place 1 patch onto the skin 2 (two) times a week.   metoCLOPramide 10 MG  tablet Commonly known as: REGLAN Take 1 tablet (10 mg total) by mouth every 8 (eight) hours as needed (headache).   rizatriptan 10 MG disintegrating tablet Commonly known as: MAXALT-MLT Take 10 mg by mouth as needed for migraine.   rosuvastatin 10 MG tablet Commonly known as: CRESTOR Take 10 mg by mouth at bedtime.   topiramate 100 MG tablet Commonly known as: TOPAMAX Take 250 mg by mouth daily.      Follow-up Information    Chesley Noon, MD. Schedule an appointment as soon as possible for a visit in 1 week(s).   Specialty: Family Medicine Contact information: [place] [address] [phone]          No Known Allergies    Consultations:  None   Other Procedures/Studies: DG CHEST PORT 1 VIEW  Result Date: 10/14/2019 CLINICAL DATA:  COVID positive with fever. EXAM: PORTABLE CHEST 1 VIEW COMPARISON:  July 14, 2010 FINDINGS: Mild infiltrates are seen within the bilateral lung bases and mid lung fields, right slightly greater than left. There is no evidence of a pleural effusion or pneumothorax. The heart size and mediastinal contours are within normal limits. The visualized skeletal structures are unremarkable. IMPRESSION: Mild bilateral infiltrates. Electronically Signed   By: Virgina Norfolk M.D.   On: 10/14/2019 15:43     TODAY-DAY OF DISCHARGE:  Subjective:   [name] today has no headache,no chest abdominal pain,no new weakness tingling or numbness, feels much better wants to go home today.   Objective:   Blood pressure 134/70, pulse 71, temperature 98.5 F (36.9 C), temperature source Oral, resp. rate 18, height 5\' 2"  (1.575 m), weight 63 kg, SpO2 100 %.  Intake/Output Summary (Last 24 hours) at 10/18/2019 1045 Last data filed at 10/18/2019 0900 Gross per 24 hour  Intake 600 ml  Output --  Net 600 ml   Filed Weights   10/13/19 2129 10/13/19 2136  Weight: 59 kg 63 kg    Exam: Awake Alert, Oriented *3, No new F.N  deficits, Normal affect [place].AT,PERRAL Supple Neck,No JVD, No cervical lymphadenopathy appriciated.  Symmetrical Chest wall movement, Good air movement bilaterally, CTAB RRR,No Gallops,Rubs or new Murmurs, No Parasternal Heave +ve B.Sounds, Abd Soft, Non tender, No organomegaly appriciated, No rebound -guarding or rigidity. No Cyanosis, Clubbing or edema, No new Rash or bruise   PERTINENT RADIOLOGIC STUDIES: DG CHEST PORT 1 VIEW  Result Date: 10/14/2019 CLINICAL DATA:  COVID positive with fever. EXAM: PORTABLE CHEST 1 VIEW COMPARISON:  July 14, 2010 FINDINGS: Mild infiltrates are seen within the bilateral lung bases  and mid lung fields, right slightly greater than left. There is no evidence of a pleural effusion or pneumothorax. The heart size and mediastinal contours are within normal limits. The visualized skeletal structures are unremarkable. IMPRESSION: Mild bilateral infiltrates. Electronically Signed   By: Virgina Norfolk M.D.   On: 10/14/2019 15:43     PERTINENT LAB RESULTS: CBC: Recent Labs    10/17/19 0431 10/18/19 0228  WBC 5.5 6.4  HGB 11.4* 11.1*  HCT 34.4* 33.4*  PLT 217 262   CMET CMP     Component Value Date/Time   NA 139 10/18/2019 0228   K 3.5 10/18/2019 0228   CL 111 10/18/2019 0228   CO2 20 (L) 10/18/2019 0228   GLUCOSE 112 (H) 10/18/2019 0228   BUN 11 10/18/2019 0228   CREATININE 0.75 10/18/2019 0228   CALCIUM 7.8 (L) 10/18/2019 0228   PROT 5.3 (L) 10/18/2019 0228   ALBUMIN 2.4 (L) 10/18/2019 0228   AST 99 (H) 10/18/2019 0228   ALT 66 (H) 10/18/2019 0228   ALKPHOS 99 10/18/2019 0228   BILITOT 0.7 10/18/2019 0228   GFRNONAA >60 10/18/2019 0228   GFRAA >60 10/18/2019 0228    GFR Estimated Creatinine Clearance: 62 mL/min (by C-G formula based on SCr of 0.75 mg/dL). No results for input(s): LIPASE, AMYLASE in the last 72 hours. No results for input(s): CKTOTAL, CKMB, CKMBINDEX, TROPONINI in the last 72 hours. Invalid input(s): POCBNP Recent Labs     10/17/19 0431 10/18/19 0228  DDIMER 0.80* 0.86*   No results for input(s): HGBA1C in the last 72 hours. No results for input(s): CHOL, HDL, LDLCALC, TRIG, CHOLHDL, LDLDIRECT in the last 72 hours. No results for input(s): TSH, T4TOTAL, T3FREE, THYROIDAB in the last 72 hours.  Invalid input(s): FREET3 Recent Labs    10/17/19 0431 10/18/19 0228  FERRITIN 237 233   Coags: No results for input(s): INR in the last 72 hours.  Invalid input(s): PT Microbiology: Recent Results (from the past 240 hour(s))  SARS Coronavirus 2 by RT PCR (hospital order, performed in [facility] hospital lab) Nasopharyngeal Nasopharyngeal Swab     Status: Abnormal   Collection Time: 10/14/19  8:42 AM   Specimen: Nasopharyngeal Swab  Result Value Ref Range Status   SARS Coronavirus 2 POSITIVE (A) NEGATIVE Final    Comment: RESULT CALLED TO, READ BACK BY AND VERIFIED WITH: RN JAIME BLUE [id number] 10/14/2019 KB (NOTE) SARS-CoV-2 target nucleic acids are DETECTED SARS-CoV-2 RNA is generally detectable in upper respiratory specimens  during the acute phase of infection.  Positive results are indicative  of the presence of the identified virus, but do not rule out bacterial infection or co-infection with other pathogens not detected by the test.  Clinical correlation with patient history and  other diagnostic information is necessary to determine patient infection status.  The expected result is negative. Fact Sheet for Patients:   StrictlyIdeas.no  Fact Sheet for Healthcare Providers:   BankingDealers.co.za   This test is not yet approved or cleared by the [place] FDA and  has been authorized for detection and/or diagnosis of SARS-CoV-2 by FDA under an Emergency Use Authorization (EUA).  This EUA will remain in effect (meaning this test can be  used) for the duration of  the COVID-19 declaration under Section 564(b)(1) of the Act, 21 U.S.C. section  360-bbb-3(b)(1), unless the authorization is terminated or revoked sooner.     FURTHER DISCHARGE INSTRUCTIONS:  Get Medicines reviewed and adjusted: Please take all your medications with you  for your next visit with your Primary MD  Laboratory/radiological data: Please request your Primary MD to go over all hospital tests and procedure/radiological results at the follow up, please ask your Primary MD to get all Hospital records sent to his/her office.  In some cases, they will be blood work, cultures and biopsy results pending at the time of your discharge. Please request that your primary care M.D. goes through all the records of your hospital data and follows up on these results.  Also Note the following: If you experience worsening of your admission symptoms, develop shortness of breath, life threatening emergency, suicidal or homicidal thoughts you must seek medical attention immediately by calling 911 or calling your MD immediately  if symptoms less severe.  You must read complete instructions/literature along with all the possible adverse reactions/side effects for all the Medicines you take and that have been prescribed to you. Take any new Medicines after you have completely understood and accpet all the possible adverse reactions/side effects.   Do not drive when taking Pain medications or sleeping medications (Benzodaizepines)  Do not take more than prescribed Pain, Sleep and Anxiety Medications. It is not advisable to combine anxiety,sleep and pain medications without talking with your primary care practitioner  Special Instructions: If you have smoked or chewed Tobacco  in the last 2 yrs please stop smoking, stop any regular Alcohol  and or any Recreational drug use.  Wear Seat belts while driving.  Please note: You were cared for by a hospitalist during your hospital stay. Once you are discharged, your primary care physician will handle any further medical issues. Please  note that NO REFILLS for any discharge medications will be authorized once you are discharged, as it is imperative that you return to your primary care physician (or establish a relationship with a primary care physician if you do not have one) for your post hospital discharge needs so that they can reassess your need for medications and monitor your lab values.  Total Time spent coordinating discharge including counseling, education and face to face time equals 35 minutes.  SignedOren Binet 10/18/2019 10:45 AM

## 2019-10-18 NOTE — Progress Notes (Signed)
D/c education provided to pt. She has no questions at this time. Husband will be providing ride home.

## 2019-10-18 NOTE — Discharge Instructions (Signed)
Person Under Monitoring Name: Kelli Richmond  Location: [place] [address]   Infection Prevention Recommendations for Individuals Confirmed to have, or Being Evaluated for, 2019 Novel Coronavirus (COVID-19) Infection Who Receive Care at Home  Individuals who are confirmed to have, or are being evaluated for, COVID-19 should follow the prevention steps below until a healthcare provider or local or state health department says they can return to normal activities.  Stay home except to get medical care You should restrict activities outside your home, except for getting medical care. Do not go to work, school, or public areas, and do not use public transportation or taxis.  Call ahead before visiting your doctor Before your medical appointment, call the healthcare provider and tell them that you have, or are being evaluated for, COVID-19 infection. This will help the healthcare provider's office take steps to keep other people from getting infected. Ask your healthcare provider to call the local or state health department.  Monitor your symptoms Seek prompt medical attention if your illness is worsening (e.g., difficulty breathing). Before going to your medical appointment, call the healthcare provider and tell them that you have, or are being evaluated for, COVID-19 infection. Ask your healthcare provider to call the local or state health department.  Wear a facemask You should wear a facemask that covers your nose and mouth when you are in the same room with other people and when you visit a healthcare provider. People who live with or visit you should also wear a facemask while they are in the same room with you.  Separate yourself from other people in your home As much as possible, you should stay in a different room from other people in your home. Also, you should use a separate bathroom, if available.  Avoid sharing household items You should not  share dishes, drinking glasses, cups, eating utensils, towels, bedding, or other items with other people in your home. After using these items, you should wash them thoroughly with soap and water.  Cover your coughs and sneezes Cover your mouth and nose with a tissue when you cough or sneeze, or you can cough or sneeze into your sleeve. Throw used tissues in a lined trash can, and immediately wash your hands with soap and water for at least 20 seconds or use an alcohol-based hand rub.  Wash your [facility] your hands often and thoroughly with soap and water for at least 20 seconds. You can use an alcohol-based hand sanitizer if soap and water are not available and if your hands are not visibly dirty. Avoid touching your eyes, nose, and mouth with unwashed hands.   Prevention Steps for Caregivers and Household Members of Individuals Confirmed to have, or Being Evaluated for, COVID-19 Infection Being Cared for in the Home  If you live with, or provide care at home for, a person confirmed to have, or being evaluated for, COVID-19 infection please follow these guidelines to prevent infection:  Follow healthcare provider's instructions Make sure that you understand and can help the patient follow any healthcare provider instructions for all care.  Provide for the patient's basic needs You should help the patient with basic needs in the home and provide support for getting groceries, prescriptions, and other personal needs.  Monitor the patient's symptoms If they are getting sicker, call his or her medical provider and tell them that the patient has, or is being evaluated for, COVID-19 infection. This will help the healthcare provider's  office take steps to keep other people from getting infected. Ask the healthcare provider to call the local or state health department.  Limit the number of people who have contact with the patient  If possible, have only one caregiver for the  patient.  Other household members should stay in another home or place of residence. If this is not possible, they should stay  in another room, or be separated from the patient as much as possible. Use a separate bathroom, if available.  Restrict visitors who do not have an essential need to be in the home.  Keep older adults, very young children, and other sick people away from the patient Keep older adults, very young children, and those who have compromised immune systems or chronic health conditions away from the patient. This includes people with chronic heart, lung, or kidney conditions, diabetes, and cancer.  Ensure good ventilation Make sure that shared spaces in the home have good air flow, such as from an air conditioner or an opened window, weather permitting.  Wash your hands often  Wash your hands often and thoroughly with soap and water for at least 20 seconds. You can use an alcohol based hand sanitizer if soap and water are not available and if your hands are not visibly dirty.  Avoid touching your eyes, nose, and mouth with unwashed hands.  Use disposable paper towels to dry your hands. If not available, use dedicated cloth towels and replace them when they become wet.  Wear a facemask and gloves  Wear a disposable facemask at all times in the room and gloves when you touch or have contact with the patient's blood, body fluids, and/or secretions or excretions, such as sweat, saliva, sputum, nasal mucus, vomit, urine, or feces.  Ensure the mask fits over your nose and mouth tightly, and do not touch it during use.  Throw out disposable facemasks and gloves after using them. Do not reuse.  Wash your hands immediately after removing your facemask and gloves.  If your personal clothing becomes contaminated, carefully remove clothing and launder. Wash your hands after handling contaminated clothing.  Place all used disposable facemasks, gloves, and other waste in a lined  container before disposing them with other household waste.  Remove gloves and wash your hands immediately after handling these items.  Do not share dishes, glasses, or other household items with the patient  Avoid sharing household items. You should not share dishes, drinking glasses, cups, eating utensils, towels, bedding, or other items with a patient who is confirmed to have, or being evaluated for, COVID-19 infection.  After the person uses these items, you should wash them thoroughly with soap and water.  Wash laundry thoroughly  Immediately remove and wash clothes or bedding that have blood, body fluids, and/or secretions or excretions, such as sweat, saliva, sputum, nasal mucus, vomit, urine, or feces, on them.  Wear gloves when handling laundry from the patient.  Read and follow directions on labels of laundry or clothing items and detergent. In general, wash and dry with the warmest temperatures recommended on the label.  Clean all areas the individual has used often  Clean all touchable surfaces, such as counters, tabletops, doorknobs, bathroom fixtures, toilets, phones, keyboards, tablets, and bedside tables, every day. Also, clean any surfaces that may have blood, body fluids, and/or secretions or excretions on them.  Wear gloves when cleaning surfaces the patient has come in contact with.  Use a diluted bleach solution (e.g., dilute bleach with 1  part bleach and 10 parts water) or a household disinfectant with a label that says EPA-registered for coronaviruses. To make a bleach solution at home, add 1 tablespoon of bleach to 1 quart (4 cups) of water. For a larger supply, add  cup of bleach to 1 gallon (16 cups) of water.  Read labels of cleaning products and follow recommendations provided on product labels. Labels contain instructions for safe and effective use of the cleaning product including precautions you should take when applying the product, such as wearing gloves or  eye protection and making sure you have good ventilation during use of the product.  Remove gloves and wash hands immediately after cleaning.  Monitor yourself for signs and symptoms of illness Caregivers and household members are considered close contacts, should monitor their health, and will be asked to limit movement outside of the home to the extent possible. Follow the monitoring steps for close contacts listed on the symptom monitoring form.   ? If you have additional questions, contact your local health department or call the epidemiologist on call at [phone] (available 24/7). ? This guidance is subject to change. For the most up-to-date guidance from [facility], please refer to their website: [email or web address]

## 2019-11-28 ENCOUNTER — Ambulatory Visit
Admission: RE | Admit: 2019-11-28 | Discharge: 2019-11-28 | Disposition: A | Discharge status: Home / Self Care | Payer: BC Managed Care – PPO | Source: Ambulatory Visit | Attending: Obstetrics and Gynecology | Admitting: Obstetrics and Gynecology

## 2019-11-28 ENCOUNTER — Other Ambulatory Visit: Payer: Self-pay | Admitting: Obstetrics and Gynecology

## 2019-11-28 ENCOUNTER — Other Ambulatory Visit: Payer: Self-pay

## 2019-11-28 DIAGNOSIS — R928 Other abnormal and inconclusive findings on diagnostic imaging of breast: Secondary | ICD-10-CM

## 2019-12-05 ENCOUNTER — Ambulatory Visit
Admission: RE | Admit: 2019-12-05 | Discharge: 2019-12-05 | Disposition: A | Discharge status: Home / Self Care | Payer: BC Managed Care – PPO | Source: Ambulatory Visit | Attending: Obstetrics and Gynecology | Admitting: Obstetrics and Gynecology

## 2019-12-05 ENCOUNTER — Other Ambulatory Visit: Payer: Self-pay

## 2019-12-05 DIAGNOSIS — R928 Other abnormal and inconclusive findings on diagnostic imaging of breast: Secondary | ICD-10-CM

## 2019-12-05 HISTORY — PX: BREAST BIOPSY: SHX20

## 2020-04-22 ENCOUNTER — Other Ambulatory Visit: Payer: Self-pay | Admitting: Obstetrics and Gynecology

## 2020-04-22 DIAGNOSIS — N6320 Unspecified lump in the left breast, unspecified quadrant: Secondary | ICD-10-CM

## 2020-05-31 ENCOUNTER — Other Ambulatory Visit: Payer: BC Managed Care – PPO

## 2020-06-17 ENCOUNTER — Ambulatory Visit
Admission: RE | Admit: 2020-06-17 | Discharge: 2020-06-17 | Disposition: A | Discharge status: Home / Self Care | Payer: BC Managed Care – PPO | Source: Ambulatory Visit | Attending: Obstetrics and Gynecology | Admitting: Obstetrics and Gynecology

## 2020-06-17 ENCOUNTER — Other Ambulatory Visit: Payer: Self-pay

## 2020-06-17 ENCOUNTER — Other Ambulatory Visit: Payer: Self-pay | Admitting: Obstetrics and Gynecology

## 2020-06-17 DIAGNOSIS — N6320 Unspecified lump in the left breast, unspecified quadrant: Secondary | ICD-10-CM

## 2021-12-04 IMAGING — MG MM  DIGITAL DIAGNOSTIC BREAST BILAT IMPLANT W/ TOMO W/ CAD
8 of 12 series · 8 of 28 positions shown · non-contrast
Comparison: Previous exam(s).

CLINICAL DATA: 64-year-old female for six-month follow-up of LEFT
breast mass and for annual bilateral mammogram.

EXAM:
DIGITAL DIAGNOSTIC BILATERAL MAMMOGRAM WITH IMPLANTS, CAD AND
TOMOSYNTHESIS; ULTRASOUND LEFT BREAST LIMITED
TECHNIQUE: Bilateral digital diagnostic mammography and breast tomosynthesis
was performed. The images were evaluated with computer-aided
detection. Standard and/or implant displaced views were performed.;
Targeted ultrasound examination of the left breast was performed.

[L CC]
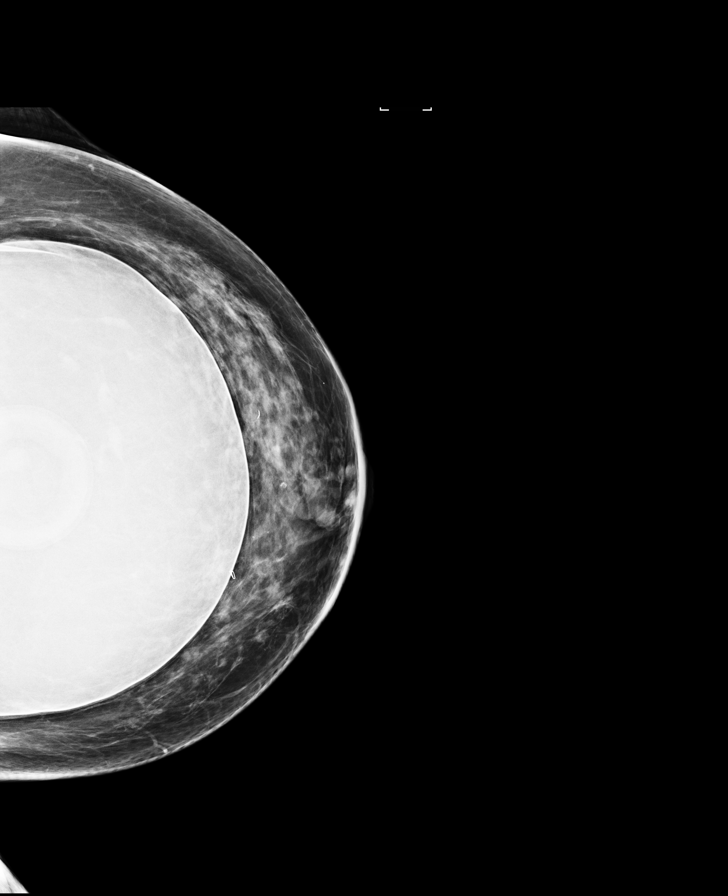

[L MLO]
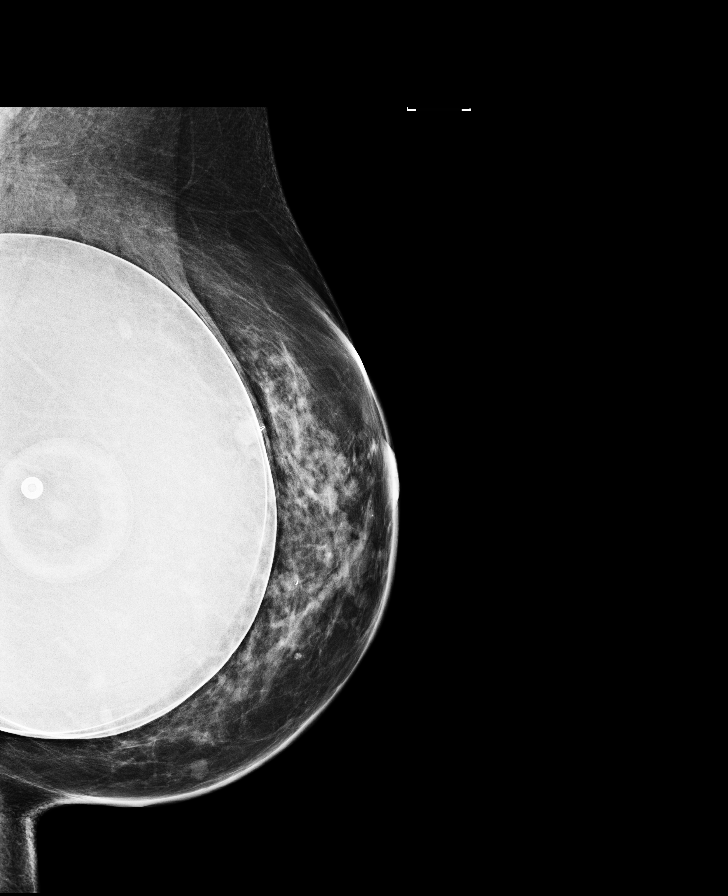

[R CC]
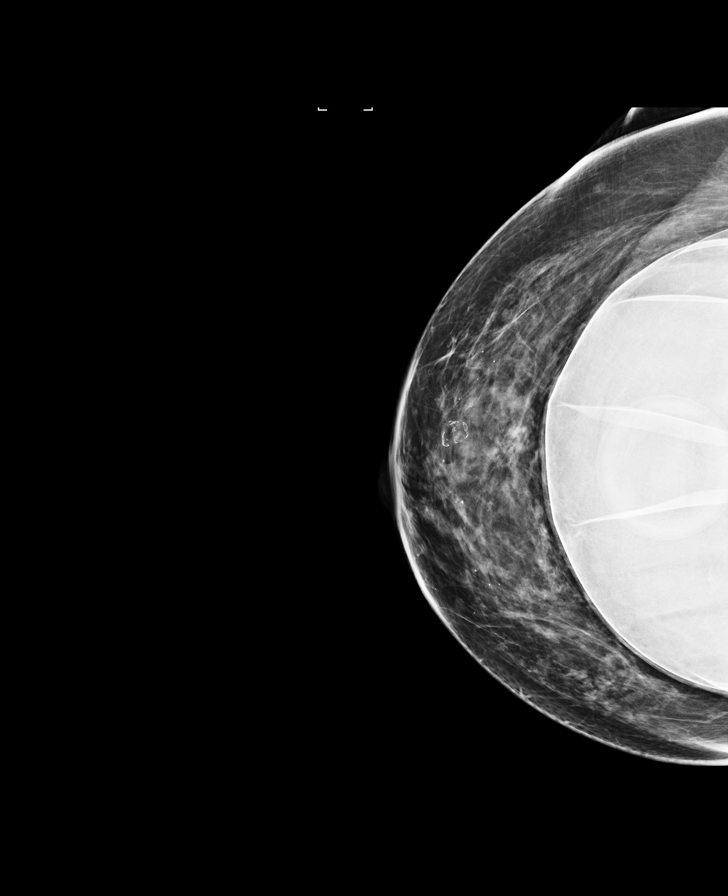

[R MLO]
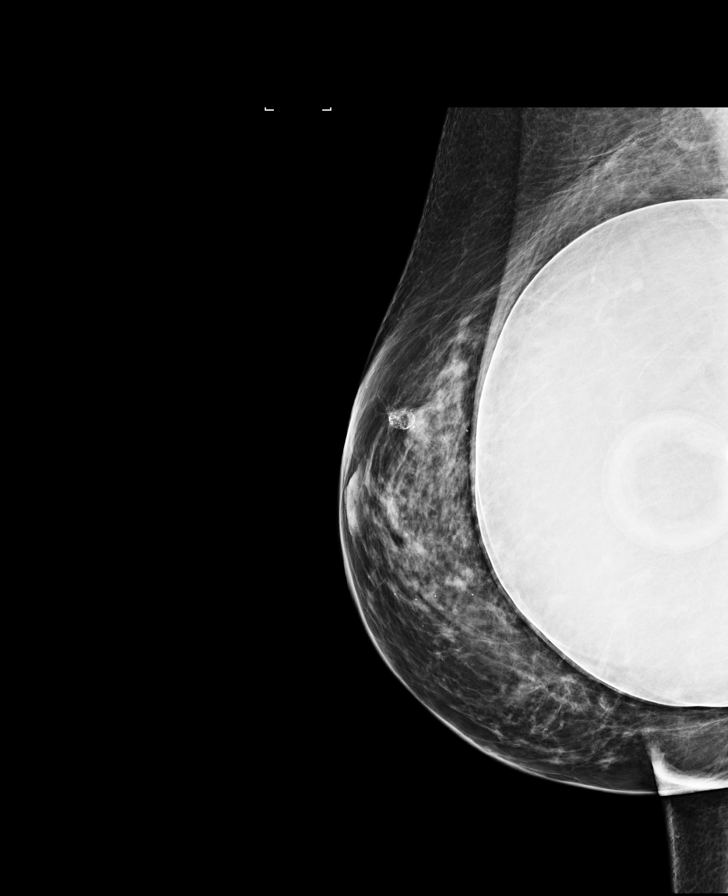

[R CC synth-2D]
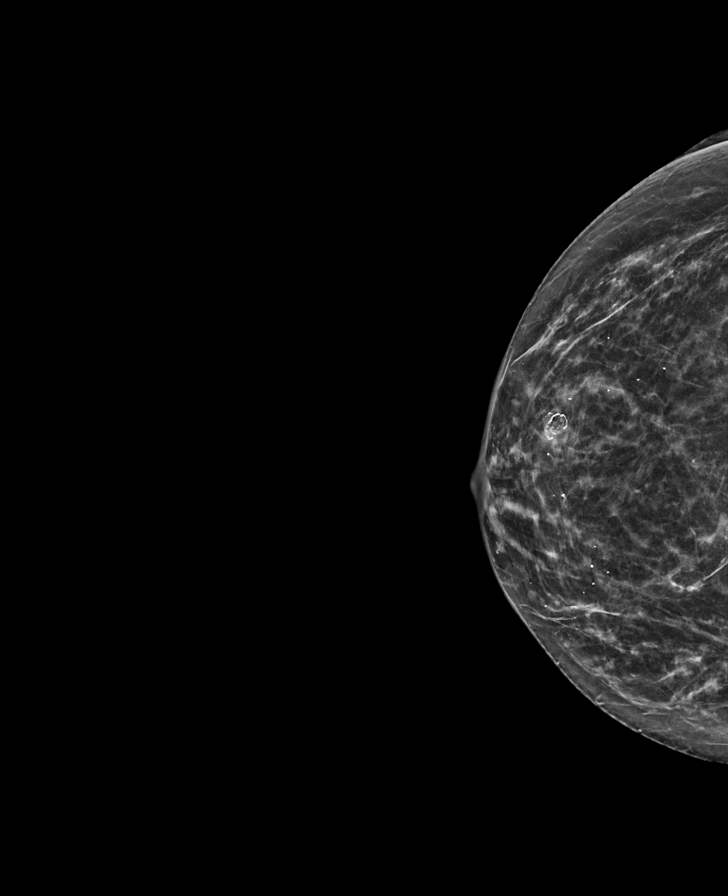

[R MLO synth-2D]
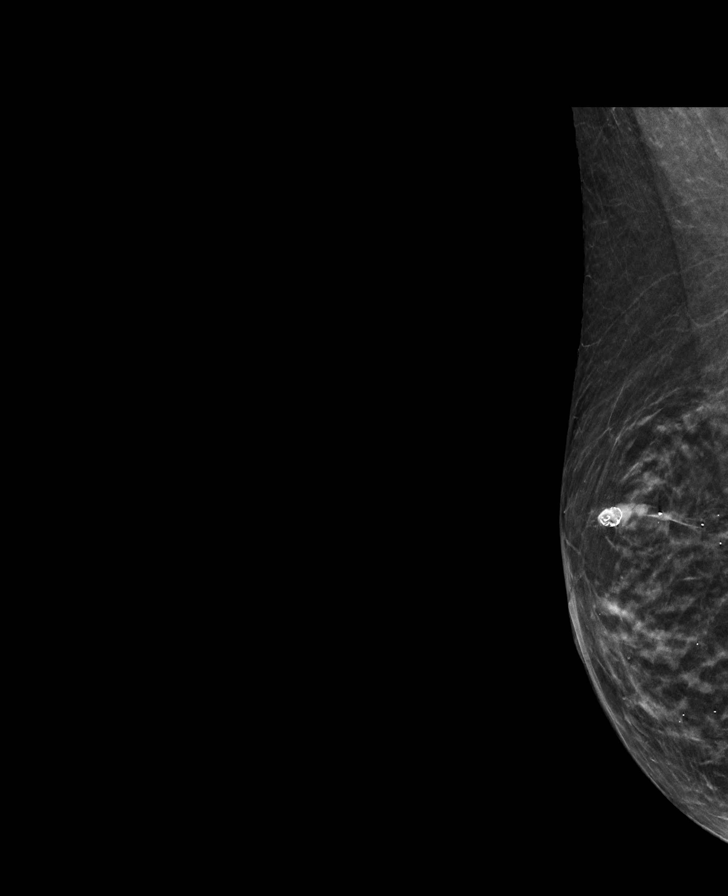

[L MLO synth-2D]
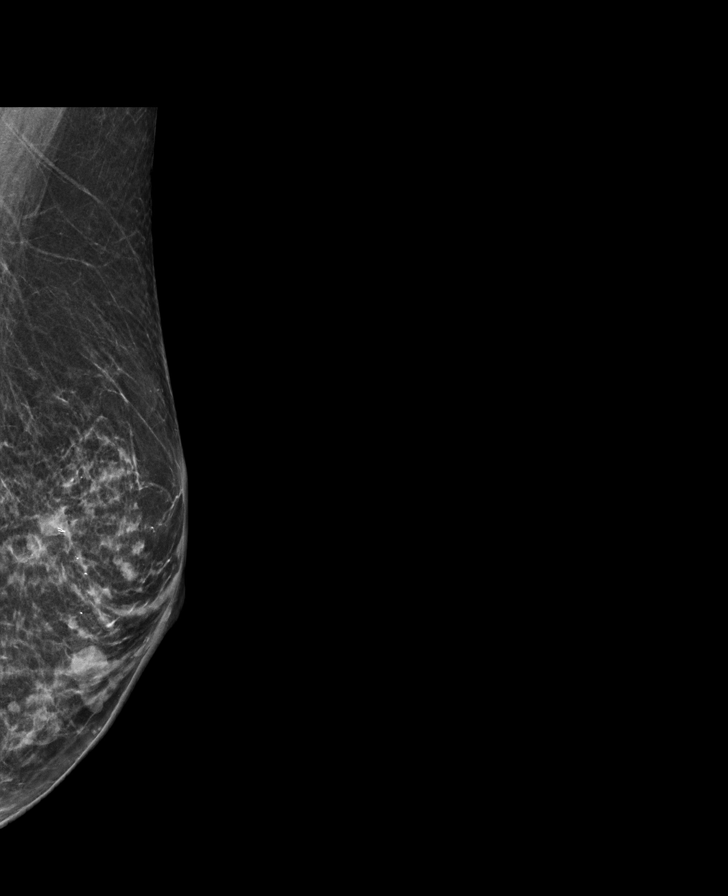

[L CC synth-2D]
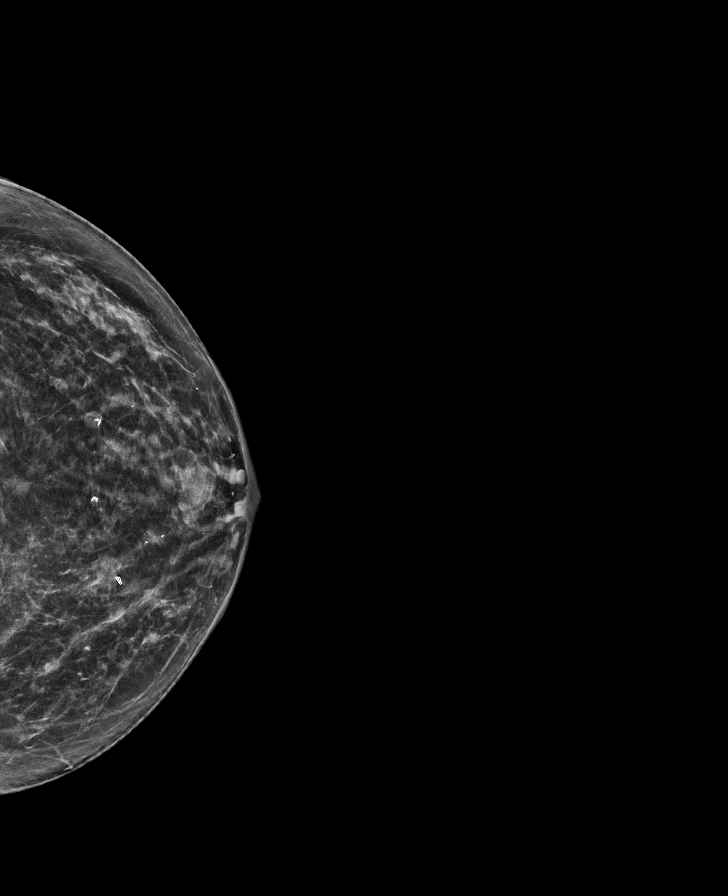

[8 of 28 positions shown; findings below may reference images not displayed]

ACR Breast Density Category b: There are scattered areas of
fibroglandular density.
FINDINGS: 2D/3D full field views of both breasts demonstrate unchanged
scattered circumscribed oval masses and benign calcifications within
both breasts.

No new or suspicious findings within either breast noted.

A biopsy clip within the UPPER INNER LEFT breast is again
identified.

The patient has retropectoral implants.

Targeted ultrasound is performed, showing stable benign cystic
changes/fat necrosis at the [DATE] position of the LEFT breast 7 cm
from the nipple.
IMPRESSION: 1. Stable benign cystic changes/fat necrosis with thin the LOWER
INNER LEFT breast. No further imaging follow-up recommended.
2. No suspicious mammographic findings within either breast.

RECOMMENDATION:
Bilateral screening mammogram in 1 year.

I have discussed the findings and recommendations with the patient.
If applicable, a reminder letter will be sent to the patient
regarding the next appointment.

BI-RADS CATEGORY  2: Benign.

## 2022-01-14 ENCOUNTER — Other Ambulatory Visit: Payer: Self-pay | Admitting: Obstetrics and Gynecology

## 2022-01-14 ENCOUNTER — Other Ambulatory Visit: Payer: Self-pay | Admitting: Physician Assistant

## 2022-01-14 DIAGNOSIS — Z1231 Encounter for screening mammogram for malignant neoplasm of breast: Secondary | ICD-10-CM

## 2022-01-29 ENCOUNTER — Ambulatory Visit
Admission: RE | Admit: 2022-01-29 | Discharge: 2022-01-29 | Disposition: A | Discharge status: Home / Self Care | Payer: Medicare Other | Source: Ambulatory Visit | Attending: Physician Assistant | Admitting: Physician Assistant

## 2022-01-29 DIAGNOSIS — Z1231 Encounter for screening mammogram for malignant neoplasm of breast: Secondary | ICD-10-CM

## 2022-02-02 ENCOUNTER — Other Ambulatory Visit: Payer: Self-pay | Admitting: Obstetrics and Gynecology

## 2022-02-02 DIAGNOSIS — N6452 Nipple discharge: Secondary | ICD-10-CM

## 2022-02-09 ENCOUNTER — Ambulatory Visit
Admission: RE | Admit: 2022-02-09 | Discharge: 2022-02-09 | Disposition: A | Discharge status: Home / Self Care | Payer: Medicare Other | Source: Ambulatory Visit | Attending: Obstetrics and Gynecology | Admitting: Obstetrics and Gynecology

## 2022-02-09 ENCOUNTER — Other Ambulatory Visit: Payer: Self-pay | Admitting: Obstetrics and Gynecology

## 2022-02-09 DIAGNOSIS — N6452 Nipple discharge: Secondary | ICD-10-CM

## 2022-03-25 ENCOUNTER — Other Ambulatory Visit: Payer: Self-pay | Admitting: Surgery

## 2022-03-25 DIAGNOSIS — Z9882 Breast implant status: Secondary | ICD-10-CM

## 2022-04-20 ENCOUNTER — Ambulatory Visit
Admission: RE | Admit: 2022-04-20 | Discharge: 2022-04-20 | Disposition: A | Discharge status: Home / Self Care | Payer: Medicare Other | Source: Ambulatory Visit | Attending: Surgery | Admitting: Surgery

## 2022-04-20 DIAGNOSIS — Z9882 Breast implant status: Secondary | ICD-10-CM

## 2022-04-20 MED ORDER — GADOPICLENOL 0.5 MMOL/ML IV SOLN
6.0000 mL | Freq: Once | INTRAVENOUS | Status: AC | PRN
  Administered 2022-04-20: 6 mL via INTRAVENOUS

## 2022-04-27 ENCOUNTER — Other Ambulatory Visit: Payer: Self-pay | Admitting: Surgery

## 2022-04-27 DIAGNOSIS — N644 Mastodynia: Secondary | ICD-10-CM

## 2022-05-08 ENCOUNTER — Ambulatory Visit
Admission: RE | Admit: 2022-05-08 | Discharge: 2022-05-08 | Disposition: A | Discharge status: Home / Self Care | Payer: Medicare Other | Source: Ambulatory Visit | Attending: Surgery | Admitting: Surgery

## 2022-05-08 ENCOUNTER — Other Ambulatory Visit: Payer: Self-pay | Admitting: Surgery

## 2022-05-08 DIAGNOSIS — N6320 Unspecified lump in the left breast, unspecified quadrant: Secondary | ICD-10-CM

## 2022-05-08 DIAGNOSIS — N644 Mastodynia: Secondary | ICD-10-CM

## 2022-05-12 ENCOUNTER — Ambulatory Visit
Admission: RE | Admit: 2022-05-12 | Discharge: 2022-05-12 | Disposition: A | Discharge status: Home / Self Care | Payer: Medicare Other | Source: Ambulatory Visit | Attending: Surgery | Admitting: Surgery

## 2022-05-12 DIAGNOSIS — N6320 Unspecified lump in the left breast, unspecified quadrant: Secondary | ICD-10-CM

## 2022-05-12 HISTORY — PX: BREAST BIOPSY: SHX20

## 2022-05-22 ENCOUNTER — Ambulatory Visit: Payer: Self-pay | Admitting: Surgery

## 2022-05-22 DIAGNOSIS — D242 Benign neoplasm of left breast: Secondary | ICD-10-CM

## 2022-05-28 ENCOUNTER — Other Ambulatory Visit: Payer: Self-pay | Admitting: Surgery

## 2022-05-28 DIAGNOSIS — D242 Benign neoplasm of left breast: Secondary | ICD-10-CM

## 2022-06-16 ENCOUNTER — Other Ambulatory Visit: Payer: Self-pay

## 2022-06-16 ENCOUNTER — Encounter (HOSPITAL_BASED_OUTPATIENT_CLINIC_OR_DEPARTMENT_OTHER): Payer: Self-pay | Admitting: Surgery

## 2022-06-16 NOTE — Progress Notes (Signed)
   06/16/22 1020  PAT Phone Screen  Is the patient taking a GLP-1 receptor agonist? No  Do You Have Diabetes? No  Do You Have Hypertension? No  Have You Ever Been to the ER for Asthma? No  Have You Taken Oral Steroids in the Past 3 Months? No  Do you Take Phenteramine or any Other Diet Drugs? No  Recent  Lab Work, EKG, CXR? No  Do you have a history of heart problems? No  Cardiologist Name 03/05/21 OV w/ Dr Merrily Brittle for consult/ elevated cholesterol  Have you ever had tests on your heart? Yes  What cardiac tests were performed? Cardiac Cath  What date/year were cardiac tests completed? Negative cardiac cath  Results viewable: [facility] Media Tab;Care Everywhere  Any Recent Hospitalizations? No  Height '5\' 2"'$  (1.575 m)  Weight 65.3 kg  Pat Appointment Scheduled No  Reason for No Appointment Not Needed

## 2022-06-22 ENCOUNTER — Ambulatory Visit
Admission: RE | Admit: 2022-06-22 | Discharge: 2022-06-22 | Disposition: A | Discharge status: Home / Self Care | Payer: Medicare Other | Source: Ambulatory Visit | Attending: Surgery | Admitting: Surgery

## 2022-06-22 DIAGNOSIS — D242 Benign neoplasm of left breast: Secondary | ICD-10-CM

## 2022-06-22 HISTORY — PX: BREAST BIOPSY: SHX20

## 2022-06-22 NOTE — Progress Notes (Signed)

## 2022-06-23 ENCOUNTER — Other Ambulatory Visit: Payer: Self-pay

## 2022-06-23 ENCOUNTER — Encounter (HOSPITAL_BASED_OUTPATIENT_CLINIC_OR_DEPARTMENT_OTHER)
Admission: RE | Disposition: A | Discharge status: Home / Self Care | Payer: Self-pay | Source: Home / Self Care | Attending: Surgery

## 2022-06-23 ENCOUNTER — Encounter (HOSPITAL_BASED_OUTPATIENT_CLINIC_OR_DEPARTMENT_OTHER): Payer: Self-pay | Admitting: Surgery

## 2022-06-23 ENCOUNTER — Ambulatory Visit (HOSPITAL_BASED_OUTPATIENT_CLINIC_OR_DEPARTMENT_OTHER): Payer: Medicare Other | Admitting: Anesthesiology

## 2022-06-23 ENCOUNTER — Ambulatory Visit
Admission: RE | Admit: 2022-06-23 | Discharge: 2022-06-23 | Disposition: A | Discharge status: Home / Self Care | Payer: Medicare Other | Source: Ambulatory Visit | Attending: Surgery | Admitting: Surgery

## 2022-06-23 ENCOUNTER — Ambulatory Visit (HOSPITAL_BASED_OUTPATIENT_CLINIC_OR_DEPARTMENT_OTHER)
Admission: RE | Admit: 2022-06-23 | Discharge: 2022-06-23 | Disposition: A | Discharge status: Home / Self Care | Payer: Medicare Other | Attending: Surgery | Admitting: Surgery

## 2022-06-23 DIAGNOSIS — D242 Benign neoplasm of left breast: Secondary | ICD-10-CM

## 2022-06-23 DIAGNOSIS — Z79890 Hormone replacement therapy (postmenopausal): Secondary | ICD-10-CM | POA: Diagnosis not present

## 2022-06-23 DIAGNOSIS — D0512 Intraductal carcinoma in situ of left breast: Secondary | ICD-10-CM | POA: Diagnosis not present

## 2022-06-23 DIAGNOSIS — C50919 Malignant neoplasm of unspecified site of unspecified female breast: Secondary | ICD-10-CM

## 2022-06-23 HISTORY — DX: Malignant neoplasm of unspecified site of unspecified female breast: C50.919

## 2022-06-23 HISTORY — PX: BREAST LUMPECTOMY WITH RADIOACTIVE SEED LOCALIZATION: SHX6424

## 2022-06-23 HISTORY — DX: Other specified postprocedural states: Z98.890

## 2022-06-23 HISTORY — DX: Other specified postprocedural states: R11.2

## 2022-06-23 SURGERY — BREAST LUMPECTOMY WITH RADIOACTIVE SEED LOCALIZATION
Anesthesia: General | Site: Breast | Laterality: Left

## 2022-06-23 MED ORDER — CEFAZOLIN SODIUM-DEXTROSE 2-4 GM/100ML-% IV SOLN
INTRAVENOUS | Status: AC
  Filled 2022-06-23: qty 100

## 2022-06-23 MED ORDER — BUPIVACAINE HCL (PF) 0.25 % IJ SOLN
INTRAMUSCULAR | Status: DC | PRN
  Administered 2022-06-23: 10 mL

## 2022-06-23 MED ORDER — LIDOCAINE 2% (20 MG/ML) 5 ML SYRINGE
INTRAMUSCULAR | Status: AC
  Filled 2022-06-23: qty 5

## 2022-06-23 MED ORDER — PROPOFOL 10 MG/ML IV BOLUS
INTRAVENOUS | Status: AC
  Filled 2022-06-23: qty 20

## 2022-06-23 MED ORDER — DEXAMETHASONE SODIUM PHOSPHATE 10 MG/ML IJ SOLN
INTRAMUSCULAR | Status: AC
  Filled 2022-06-23: qty 1

## 2022-06-23 MED ORDER — CEFAZOLIN SODIUM-DEXTROSE 2-4 GM/100ML-% IV SOLN
2.0000 g | INTRAVENOUS | Status: AC
  Administered 2022-06-23: 2 g via INTRAVENOUS

## 2022-06-23 MED ORDER — SODIUM CHLORIDE 0.9 % IV SOLN
INTRAVENOUS | Status: DC | PRN
  Administered 2022-06-23: 500 mL

## 2022-06-23 MED ORDER — PROPOFOL 10 MG/ML IV BOLUS
INTRAVENOUS | Status: DC | PRN
  Administered 2022-06-23: 150 mg via INTRAVENOUS

## 2022-06-23 MED ORDER — EPHEDRINE SULFATE (PRESSORS) 50 MG/ML IJ SOLN
INTRAMUSCULAR | Status: DC | PRN
  Administered 2022-06-23: 10 mg via INTRAVENOUS

## 2022-06-23 MED ORDER — LIDOCAINE HCL (CARDIAC) PF 100 MG/5ML IV SOSY
PREFILLED_SYRINGE | INTRAVENOUS | Status: DC | PRN
  Administered 2022-06-23: 60 mg via INTRAVENOUS

## 2022-06-23 MED ORDER — CHLORHEXIDINE GLUCONATE CLOTH 2 % EX PADS: 6.0000 | MEDICATED_PAD | Freq: Once | CUTANEOUS | Status: DC

## 2022-06-23 MED ORDER — ACETAMINOPHEN 500 MG PO TABS
1000.0000 mg | ORAL_TABLET | ORAL | Status: AC
  Administered 2022-06-23: 1000 mg via ORAL

## 2022-06-23 MED ORDER — SODIUM CHLORIDE 0.9 % IV SOLN
INTRAVENOUS | Status: AC
  Filled 2022-06-23: qty 10

## 2022-06-23 MED ORDER — MIDAZOLAM HCL 5 MG/5ML IJ SOLN
INTRAMUSCULAR | Status: DC | PRN
  Administered 2022-06-23: 2 mg via INTRAVENOUS

## 2022-06-23 MED ORDER — FENTANYL CITRATE (PF) 100 MCG/2ML IJ SOLN: 25.0000 ug | INTRAMUSCULAR | Status: DC | PRN

## 2022-06-23 MED ORDER — MIDAZOLAM HCL 2 MG/2ML IJ SOLN
INTRAMUSCULAR | Status: AC
  Filled 2022-06-23: qty 2

## 2022-06-23 MED ORDER — ONDANSETRON HCL 4 MG/2ML IJ SOLN
INTRAMUSCULAR | Status: AC
  Filled 2022-06-23: qty 2

## 2022-06-23 MED ORDER — FENTANYL CITRATE (PF) 100 MCG/2ML IJ SOLN
INTRAMUSCULAR | Status: DC | PRN
  Administered 2022-06-23: 50 ug via INTRAVENOUS

## 2022-06-23 MED ORDER — LACTATED RINGERS IV SOLN: INTRAVENOUS | Status: DC

## 2022-06-23 MED ORDER — ONDANSETRON HCL 4 MG/2ML IJ SOLN
INTRAMUSCULAR | Status: DC | PRN
  Administered 2022-06-23: 4 mg via INTRAVENOUS

## 2022-06-23 MED ORDER — OXYCODONE HCL 5 MG PO TABS: 5.0000 mg | ORAL_TABLET | Freq: Four times a day (QID) | ORAL | 0 refills | Status: DC | PRN

## 2022-06-23 MED ORDER — FENTANYL CITRATE (PF) 100 MCG/2ML IJ SOLN
INTRAMUSCULAR | Status: AC
  Filled 2022-06-23: qty 2

## 2022-06-23 MED ORDER — IBUPROFEN 800 MG PO TABS: 800.0000 mg | ORAL_TABLET | Freq: Three times a day (TID) | ORAL | 0 refills | Status: DC | PRN

## 2022-06-23 MED ORDER — DEXAMETHASONE SODIUM PHOSPHATE 4 MG/ML IJ SOLN
INTRAMUSCULAR | Status: DC | PRN
  Administered 2022-06-23: 5 mg via INTRAVENOUS

## 2022-06-23 MED ORDER — AMISULPRIDE (ANTIEMETIC) 5 MG/2ML IV SOLN: 10.0000 mg | Freq: Once | INTRAVENOUS | Status: DC | PRN

## 2022-06-23 MED ORDER — PROPOFOL 500 MG/50ML IV EMUL
INTRAVENOUS | Status: DC | PRN
  Administered 2022-06-23: 40 ug/kg/min via INTRAVENOUS

## 2022-06-23 MED ORDER — ACETAMINOPHEN 500 MG PO TABS
ORAL_TABLET | ORAL | Status: AC
  Filled 2022-06-23: qty 2

## 2022-06-23 SURGICAL SUPPLY — 49 items
ADH SKN CLS APL DERMABOND .7 (GAUZE/BANDAGES/DRESSINGS) ×1
APL PRP STRL LF DISP 70% ISPRP (MISCELLANEOUS) ×1
APPLIER CLIP 9.375 MED OPEN (MISCELLANEOUS)
APR CLP MED 9.3 20 MLT OPN (MISCELLANEOUS)
BINDER BREAST LRG (GAUZE/BANDAGES/DRESSINGS) IMPLANT
BINDER BREAST MEDIUM (GAUZE/BANDAGES/DRESSINGS) IMPLANT
BINDER BREAST XLRG (GAUZE/BANDAGES/DRESSINGS) IMPLANT
BINDER BREAST XXLRG (GAUZE/BANDAGES/DRESSINGS) IMPLANT
BLADE SURG 15 STRL LF DISP TIS (BLADE) ×1 IMPLANT
BLADE SURG 15 STRL SS (BLADE) ×1
CANISTER SUC SOCK COL 7IN (MISCELLANEOUS) IMPLANT
CANISTER SUCT 1200ML W/VALVE (MISCELLANEOUS) IMPLANT
CHLORAPREP W/TINT 26 (MISCELLANEOUS) ×1 IMPLANT
CLIP APPLIE 9.375 MED OPEN (MISCELLANEOUS) IMPLANT
COVER BACK TABLE 60X90IN (DRAPES) ×1 IMPLANT
COVER MAYO STAND STRL (DRAPES) ×1 IMPLANT
COVER PROBE CYLINDRICAL 5X96 (MISCELLANEOUS) ×1 IMPLANT
DERMABOND ADVANCED .7 DNX12 (GAUZE/BANDAGES/DRESSINGS) ×1 IMPLANT
DRAPE LAPAROTOMY 100X72 PEDS (DRAPES) ×1 IMPLANT
DRAPE UTILITY XL STRL (DRAPES) ×1 IMPLANT
ELECT COATED BLADE 2.86 ST (ELECTRODE) ×1 IMPLANT
ELECT REM PT RETURN 9FT ADLT (ELECTROSURGICAL) ×1
ELECTRODE REM PT RTRN 9FT ADLT (ELECTROSURGICAL) ×1 IMPLANT
GLOVE BIO SURGEON STRL SZ 6.5 (GLOVE) IMPLANT
GLOVE BIOGEL PI IND STRL 8 (GLOVE) ×1 IMPLANT
GLOVE ECLIPSE 8.0 STRL XLNG CF (GLOVE) ×1 IMPLANT
GOWN STRL REUS W/ TWL LRG LVL3 (GOWN DISPOSABLE) ×2 IMPLANT
GOWN STRL REUS W/ TWL XL LVL3 (GOWN DISPOSABLE) ×1 IMPLANT
GOWN STRL REUS W/TWL LRG LVL3 (GOWN DISPOSABLE) ×2
GOWN STRL REUS W/TWL XL LVL3 (GOWN DISPOSABLE) ×1
HEMOSTAT ARISTA ABSORB 3G PWDR (HEMOSTASIS) IMPLANT
HEMOSTAT SNOW SURGICEL 2X4 (HEMOSTASIS) IMPLANT
KIT MARKER MARGIN INK (KITS) ×1 IMPLANT
NDL HYPO 25X1 1.5 SAFETY (NEEDLE) ×1 IMPLANT
NEEDLE HYPO 25X1 1.5 SAFETY (NEEDLE) ×1 IMPLANT
NS IRRIG 1000ML POUR BTL (IV SOLUTION) ×1 IMPLANT
PACK BASIN DAY SURGERY FS (CUSTOM PROCEDURE TRAY) ×1 IMPLANT
PENCIL SMOKE EVACUATOR (MISCELLANEOUS) ×1 IMPLANT
SLEEVE SCD COMPRESS KNEE MED (STOCKING) ×1 IMPLANT
SPIKE FLUID TRANSFER (MISCELLANEOUS) IMPLANT
SPONGE T-LAP 4X18 ~~LOC~~+RFID (SPONGE) ×1 IMPLANT
SUT MNCRL AB 4-0 PS2 18 (SUTURE) ×1 IMPLANT
SUT SILK 2 0 SH (SUTURE) IMPLANT
SUT VICRYL 3-0 CR8 SH (SUTURE) ×1 IMPLANT
SYR CONTROL 10ML LL (SYRINGE) ×1 IMPLANT
TOWEL GREEN STERILE FF (TOWEL DISPOSABLE) ×1 IMPLANT
TRAY FAXITRON CT DISP (TRAY / TRAY PROCEDURE) ×1 IMPLANT
TUBE CONNECTING 20X1/4 (TUBING) IMPLANT
YANKAUER SUCT BULB TIP NO VENT (SUCTIONS) IMPLANT

## 2022-06-23 NOTE — Anesthesia Procedure Notes (Signed)
Procedure Name: LMA Insertion Date/Time: 06/23/2022 11:15 AM  Performed by: Verita Lamb, CRNAPre-anesthesia Checklist: Patient identified, Emergency Drugs available, Suction available and Patient being monitored Patient Re-evaluated:Patient Re-evaluated prior to induction Oxygen Delivery Method: Circle system utilized Preoxygenation: Pre-oxygenation with 100% oxygen Induction Type: IV induction Ventilation: Mask ventilation without difficulty LMA: LMA inserted LMA Size: 4.0 Number of attempts: 1 Airway Equipment and Method: Bite block Placement Confirmation: positive ETCO2, CO2 detector and breath sounds checked- equal and bilateral Tube secured with: Tape Dental Injury: Teeth and Oropharynx as per pre-operative assessment

## 2022-06-23 NOTE — Anesthesia Postprocedure Evaluation (Signed)
Anesthesia Post Note  Patient: Kelli Richmond  Procedure(s) Performed: LEFT BREAST LUMPECTOMY WITH RADIOACTIVE SEED LOCALIZATION (Left: Breast)     Patient location during evaluation: PACU Anesthesia Type: General Level of consciousness: awake and alert Pain management: pain level controlled Vital Signs Assessment: post-procedure vital signs reviewed and stable Respiratory status: spontaneous breathing, nonlabored ventilation, respiratory function stable and patient connected to nasal cannula oxygen Cardiovascular status: blood pressure returned to baseline and stable Postop Assessment: no apparent nausea or vomiting Anesthetic complications: no   No notable events documented.  Last Vitals:  Vitals:   06/23/22 1230 06/23/22 1248  BP: 115/62 (!) 96/55  Pulse: 80 84  Resp: 13   Temp:  36.6 C  SpO2: 96% 97%    Last Pain:  Vitals:   06/23/22 1248  TempSrc: Oral  PainSc: 0-No pain                 Belenda Cruise P Maddalyn Lutze

## 2022-06-23 NOTE — Interval H&P Note (Signed)
History and Physical Interval Note:  06/23/2022 10:39 AM  Kelli Richmond  has presented today for surgery, with the diagnosis of LEFT BREAST PAPILLOMA.  The various methods of treatment have been discussed with the patient and family. After consideration of risks, benefits and other options for treatment, the patient has consented to  Procedure(s): LEFT BREAST LUMPECTOMY WITH RADIOACTIVE SEED LOCALIZATION (Left) as a surgical intervention.  The patient's history has been reviewed, patient examined, no change in status, stable for surgery.  I have reviewed the patient's chart and labs.  Questions were answered to the patient's satisfaction.     [place]

## 2022-06-23 NOTE — Op Note (Signed)
Preoperative diagnosis: Left breast papilloma  Postoperative diagnosis: Same  Procedure: Left breast seed localized lumpectomy  Surgeon: Erroll Luna, MD  Anesthesia: LMA with 0.25% Marcaine plain  EBL: Minimal  Specimen: Left breast tissue with seed and clip plus additional lateral margin completing a third clip.  Drains: None  IV fluids: Per anesthesia record  Indications for procedure: The patient is a 67 year old female with a core biopsy proven papilloma.  Given the size being almost a centimeter excision was recommended.  Risks and benefits of lumpectomy reviewed.  She had previous retropectoral implants and risks of these were also assessed with the patient.  After discussion the above as well as nonoperative management strategies, she wished to have the area removed.The procedure has been discussed with the patient. Alternatives to surgery have been discussed with the patient.  Risks of surgery include bleeding,  Infection,  Seroma formation, death,  and the need for further surgery.   The patient understands and wishes to proceed.    Description of procedure: The patient was met in the holding area and questions were answered.  Of note she had seed placement as an outpatient.  Left side was marked as correct and films were available for review.  She was taken back to the operative room.  She is placed supine upon the OR table.  After induction of general esthesia, left breast was prepped and draped in sterile fashion and timeout performed.  Proper patient, site and procedure verified.  Films were available in the operating room as well.  A curvilinear incision was made along the medial border of the nipple-areolar complex.  Of note she had previous breast reduction as well as common augmentation.  The old scar was entered.  Dissection was carried down toward the signal.  All tissue around the seed and clip were excised with a grossly negative margins.  Intraoperative imaging showed  the seed and clip corresponding to the biopsy.  There is additional clip more lateral.  Lateral tissue was removed and the images of this showed the second clip.  It was all submitted to pathology after orientation with ink.  The cavities made hemostatic with cautery.  Irrigation was used as well as local anesthetic was infiltrated with care taken not to rupture the implant that was in place.  Deep tissue layers were closed with 3-0 Vicryl.  4 Monocryl was used to close the skin in a subcuticular fashion.  All counts were found to be correct.  Breast binder placed.  The patient was then awoke extubated taken to recovery in satisfactory condition.

## 2022-06-23 NOTE — Anesthesia Preprocedure Evaluation (Addendum)
Anesthesia Evaluation  Patient identified by MRN, date of birth, ID band Patient awake    Reviewed: Allergy & Precautions, NPO status , Patient's Chart, lab work & pertinent test results  History of Anesthesia Complications (+) PONV and history of anesthetic complications  Airway Mallampati: II  TM Distance: >3 FB Neck ROM: Full    Dental no notable dental hx.    Pulmonary neg pulmonary ROS   Pulmonary exam normal        Cardiovascular negative cardio ROS  Rhythm:Regular Rate:Normal     Neuro/Psych  Headaches   Depression       GI/Hepatic negative GI ROS, Neg liver ROS,,,  Endo/Other  negative endocrine ROS    Renal/GU negative Renal ROS  negative genitourinary   Musculoskeletal negative musculoskeletal ROS (+)    Abdominal Normal abdominal exam  (+)   Peds  Hematology negative hematology ROS (+)   Anesthesia Other Findings   Reproductive/Obstetrics                             Anesthesia Physical Anesthesia Plan  ASA: 2  Anesthesia Plan: General   Post-op Pain Management:    Induction: Intravenous  PONV Risk Score and Plan: 4 or greater and Ondansetron, Dexamethasone, Midazolam, Treatment may vary due to age or medical condition and Amisulpride  Airway Management Planned: Mask and LMA  Additional Equipment: None  Intra-op Plan:   Post-operative Plan: Extubation in OR  Informed Consent: I have reviewed the patients History and Physical, chart, labs and discussed the procedure including the risks, benefits and alternatives for the proposed anesthesia with the patient or authorized representative who has indicated his/her understanding and acceptance.     Dental advisory given  Plan Discussed with: CRNA  Anesthesia Plan Comments:        Anesthesia Quick Evaluation

## 2022-06-23 NOTE — Transfer of Care (Signed)
Immediate Anesthesia Transfer of Care Note  Patient: Kelli Richmond [facility]  Procedure(s) Performed: LEFT BREAST LUMPECTOMY WITH RADIOACTIVE SEED LOCALIZATION (Left: Breast)  Patient Location: PACU  Anesthesia Type:General  Level of Consciousness: awake, alert , and patient cooperative  Airway & Oxygen Therapy: Patient Spontanous Breathing and Patient connected to face mask oxygen  Post-op Assessment: Report given to RN and Post -op Vital signs reviewed and stable  Post vital signs: Reviewed and stable  Last Vitals:  Vitals Value Taken Time  BP 99/51 06/23/22 1209  Temp    Pulse 82 06/23/22 1213  Resp 11 06/23/22 1214  SpO2 98 % 06/23/22 1213  Vitals shown include unvalidated device data.  Last Pain:  Vitals:   06/23/22 1210  TempSrc:   PainSc: 0-No pain         Complications: No notable events documented.

## 2022-06-23 NOTE — Discharge Instructions (Addendum)
[place] Office Phone Number [phone]  BREAST BIOPSY/ PARTIAL MASTECTOMY: POST OP INSTRUCTIONS  Always review your discharge instruction sheet given to you by the facility where your surgery was performed.  IF YOU HAVE DISABILITY OR FAMILY LEAVE FORMS, YOU MUST BRING THEM TO THE OFFICE FOR PROCESSING.  DO NOT GIVE THEM TO YOUR DOCTOR.  A prescription for pain medication may be given to you upon discharge.  Take your pain medication as prescribed, if needed.  If narcotic pain medicine is not needed, then you may take acetaminophen (Tylenol) or ibuprofen (Advil) as needed. Take your usually prescribed medications unless otherwise directed If you need a refill on your pain medication, please contact your pharmacy.  They will contact our office to request authorization.  Prescriptions will not be filled after 5pm or on week-ends. You should eat very light the first 24 hours after surgery, such as soup, crackers, pudding, etc.  Resume your normal diet the day after surgery. Most patients will experience some swelling and bruising in the breast.  Ice packs and a good support bra will help.  Swelling and bruising can take several days to resolve.  It is common to experience some constipation if taking pain medication after surgery.  Increasing fluid intake and taking a stool softener will usually help or prevent this problem from occurring.  A mild laxative (Milk of Magnesia or Miralax) should be taken according to package directions if there are no bowel movements after 48 hours. Unless discharge instructions indicate otherwise, you may remove your bandages 24-48 hours after surgery, and you may shower at that time.  You may have steri-strips (small skin tapes) in place directly over the incision.  These strips should be left on the skin for 7-10 days.  If your surgeon used skin glue on the incision, you may shower in 24 hours.  The glue will flake off over the next 2-3 weeks.  Any  sutures or staples will be removed at the office during your follow-up visit. ACTIVITIES:  You may resume regular daily activities (gradually increasing) beginning the next day.  Wearing a good support bra or sports bra minimizes pain and swelling.  You may have sexual intercourse when it is comfortable. You may drive when you no longer are taking prescription pain medication, you can comfortably wear a seatbelt, and you can safely maneuver your car and apply brakes. RETURN TO WORK:  ______________________________________________________________________________________ Dennis Bast should see your doctor in the office for a follow-up appointment approximately two weeks after your surgery.  Your doctor's nurse will typically make your follow-up appointment when she calls you with your pathology report.  Expect your pathology report 2-3 business days after your surgery.  You may call to check if you do not hear from [place] after three days. OTHER INSTRUCTIONS: _______________________________________________________________________________________________ _____________________________________________________________________________________________________________________________________ _____________________________________________________________________________________________________________________________________ _____________________________________________________________________________________________________________________________________  WHEN TO CALL YOUR DOCTOR: Fever over 101.0 Nausea and/or vomiting. Extreme swelling or bruising. Continued bleeding from incision. Increased pain, redness, or drainage from the incision.  The clinic staff is available to answer your questions during regular business hours.  Please don't hesitate to call and ask to speak to one of the nurses for clinical concerns.  If you have a medical emergency, go to the nearest emergency room or call 911.  A surgeon from [facility] is always on call at the hospital.  For further questions, please visit [email or web address]     Next dose of Tylenol can be take at 4pm.

## 2022-06-23 NOTE — H&P (Signed)
History of Present Illness: Kelli Richmond is a 67 y.o. female who is seen today as an office consultation for evaluation of Follow-up (NEW PROBLEM - Left breast papilloma) .   Patient returns after being seen in November for nipple discharge. MRI was negative but a lesion was found in the left breast upper inner quadrant core biopsy proven to be fragments pl. she presents today for follow-up. She also found out more about her family history is extensive history of what sounds like ovarian or possibly breast cancer and lymphoma in her family. She has some bruising from the biopsy but otherwise is stable.  Review of Systems: A complete review of systems was obtained from the patient. I have reviewed this information and discussed as appropriate with the patient. See HPI as well for other ROS.    Medical History: Past Medical History:  Diagnosis Date  History of cancer   There is no problem list on file for this patient.  Past Surgical History:  Procedure Laterality Date  HYSTERECTOMY    No Known Allergies  Current Outpatient Medications on File Prior to Visit  Medication Sig Dispense Refill  doxycycline (VIBRAMYCIN) 100 MG capsule Take 1 mg every day by oral route.  DULoxetine (CYMBALTA) 60 MG DR capsule Take 1 capsule by mouth once daily  estradiol (VIVELLE-DOT) patch 0.075 mg/24hr Place 1 patch onto the skin twice a week  rizatriptan (MAXALT-MLT) 10 MG disintegrating tablet  rosuvastatin (CRESTOR) 20 MG tablet  topiramate (TOPAMAX) 100 MG tablet 3 tablets every day by oral route.   No current facility-administered medications on file prior to visit.   No family history on file.   Social History   Tobacco Use  Smoking Status Not on file  Smokeless Tobacco Not on file    Social History   Socioeconomic History  Marital status: Married   Objective:  There were no vitals filed for this visit.  There is no height or weight on file to calculate BMI.  Physical  Exam HENT:  Head: Normocephalic.  Chest:   Comments: Bruising left breast noted. Bilateral implants noted. These are still intact. No masses noted. Musculoskeletal:  General: Normal range of motion.  Cervical back: Normal range of motion.  Lymphadenopathy:  Upper Body:  Right upper body: No supraclavicular or axillary adenopathy.  Left upper body: No supraclavicular or axillary adenopathy.  Neurological:  General: No focal deficit present.  Mental Status: She is alert.     Labs, Imaging and Diagnostic Testing:  CLINICAL DATA: Patient presents for bilateral diagnostic examination due to a 6 week history of bilateral nipple discharge described as green crustiness on the nipple surface noted in the mornings, left worse than right. No clear or bloody nipple discharge noted. Patient is on hormone replacement therapy with estrogen patch.  EXAM: DIGITAL DIAGNOSTIC BILATERAL MAMMOGRAM WITH IMPLANTS, TOMOSYNTHESIS  TECHNIQUE: Bilateral digital diagnostic mammography and breast tomosynthesis was performed. Standard and/or implant displaced views were performed.  COMPARISON: Previous exam(s).  ACR Breast Density Category b: There are scattered areas of fibroglandular density.  FINDINGS: Bilateral retropectoral saline implants are intact and unchanged. No suspicious abnormality within the retroareolar regions bilaterally to account for patient's above discharge. Exam is otherwise unchanged.  On physical exam, there is light greenish/yellowish nipple discharge noted more easily seen on the right side upon compression. This is compatible with a hormonal etiology.  IMPRESSION: Bilateral nipple discharge compatible with a hormonal etiology. Otherwise, stable mammogram.  RECOMMENDATION: Recommend assessment of serum prolactin level. If discharge  becomes clear or bloody in nature, recommend re-evaluation. Otherwise recommend continued annual bilateral screening  mammographic follow-up.  I have discussed the findings and recommendations with the patient. If applicable, a reminder letter will be sent to the patient regarding the next appointment.  BI-RADS CATEGORY 1: Negative.   Electronically Signed By: Marin Olp M.D. On: 02/09/2022 10:21  CLINICAL DATA: 67 year old with a personal history of BILATERAL breast lift (reduction mammoplasty and augmentation), presenting with an approximate 3 month history of BILATERAL nipple discharge. Personal history of benign core needle biopsy of the LEFT breast in July, 2021.  EXAM: BILATERAL BREAST MRI WITH AND WITHOUT CONTRAST  TECHNIQUE: Multiplanar, multisequence MR images of both breasts were obtained prior to and following the intravenous administration of 6 ml of view VUEWAY.  Three-dimensional MR images were rendered by post-processing of the original MR data on an independent workstation. The three-dimensional MR images were interpreted, and findings are reported in the following complete MRI report for this study. Three dimensional images were evaluated at the independent interpreting workstation using the DynaCAD thin client.  COMPARISON: No prior MRI. Mammography 02/09/2022 and earlier. LEFT breast ultrasound 06/17/2020 and earlier.  FINDINGS: Breast composition: b. Scattered fibroglandular tissue.  Background parenchymal enhancement: Mild to moderate  RIGHT breast: No suspicious mass or abnormal enhancement. No findings to explain nipple discharge, indicating the discharge is physiologic. Intact retropectoral saline implant containing numerous folds.  LEFT breast: Circumscribed mass with heterogeneous enhancement in the UPPER INNER QUADRANT at the 10 o'clock location, associated with blooming artifact from the wing shaped tissue marking clip placed at the time of prior biopsy, demonstrating plateau kinetics, measuring approximately 1.0 x 0.9 x 1.0 cm. (The mass measured  approximately 0.9 x 0.5 x 0.7 cm on a prior ultrasound 11/28/2019).  No suspicious mass or abnormal enhancement elsewhere. No findings to explain nipple discharge, indicating the discharge is physiologic. Intact retropectoral saline implant containing numerous folds.  Lymph nodes: No abnormal appearing lymph nodes.  Ancillary findings: None.  IMPRESSION: 1. The previously biopsied mass in the [facility] of the LEFT breast at 10 o'clock has increased slightly in size when compared to the LEFT breast ultrasound 11/28/2019 at the time of biopsy. Measurements are given above. 2. No MRI evidence of malignancy involving the RIGHT breast. 3. No findings to explain the patient's BILATERAL nipple discharge, indicating that the discharge is physiologic. 4. Intact retropectoral saline implants.  RECOMMENDATION: LEFT breast ultrasound in order to determine if the mass at 10 o'clock has indeed increased in size in the interval. If there has been significant interval increase, repeat biopsy would be recommended at that time.  BI-RADS CATEGORY 4: Suspicious. (4A)   Electronically Signed By: Evangeline Dakin M.D. On: 04/22/2022 13:02  Breast, left, needle core biopsy, 10 o'clock - SCANT FRAGMENTS SUGGESTIVE OF AN INTRADUCTAL PAPILLOMA. SEE NOTE  Assessment and Plan:   Diagnoses and all orders for this visit:  Nipple discharge  Papilloma of left breast   Patient desires left breast seed lumpectomy. Will arrange that. Also will arrange for genetic testing given family history. Risk and benefits of surgery as well as complications reviewed.The procedure has been discussed with the patient. Alternatives to surgery have been discussed with the patient. Risks of surgery include bleeding, Infection, Seroma formation, death, and the need for further surgery. The patient understands and wishes to proceed.   No follow-ups on file.  Kennieth Francois, MD

## 2022-06-24 ENCOUNTER — Encounter (HOSPITAL_BASED_OUTPATIENT_CLINIC_OR_DEPARTMENT_OTHER): Payer: Self-pay | Admitting: Surgery

## 2022-06-26 ENCOUNTER — Encounter: Payer: Self-pay | Admitting: Surgery

## 2022-06-29 LAB — SURGICAL PATHOLOGY

## 2022-07-02 ENCOUNTER — Encounter (HOSPITAL_COMMUNITY): Payer: Self-pay

## 2022-07-03 ENCOUNTER — Telehealth: Payer: Self-pay | Admitting: Hematology and Oncology

## 2022-07-03 NOTE — Telephone Encounter (Signed)
Scheduled appt per 2/23 referral. Pt is aware of appt date and time. Pt is aware to arrive 15 mins prior to appt time and to bring and updated insurance card. Pt is aware of appt location.   °

## 2022-07-08 NOTE — Progress Notes (Signed)
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 MRI was negative but a lesion was found in the left breast upper inner quadrant core biopsy proven to be fragments pl. she presents today for follow-up. She also found out more about her family history is extensive history of what sounds like ovarian or possibly breast cancer and lymphoma in her family. She has some bruising from the biopsy but otherwise is stable.   Histology per Pathology Report: 06/23/2022  FINAL MICROSCOPIC DIAGNOSIS:  A. BREAST, LEFT W/SEED, LUMPECTOMY: - Benign breast parenchyma with previous biopsy-related changes - Mild fibrocystic changes - Negative for residual A breast prognostic profile (ER, PR) is pending and will be reported in an addendum.  B. 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 Luisa Hart -Left Breast Lumpectomy with radioactive seed localization 06/23/2022  Bilateral Augmentation Mammaplasty 2015  Past/Anticipated interventions by medical oncology, if any: Pt to see Dr. Pamelia Hoit on 07-27-22   Family history of Breast/Ovarian/Prostate Cancer: Maternal Grandmother had breast. She thinks at least 3 of her mother's sisters had breast cancer.  Lymphedema issues, if any: No     Pain issues, if any:  She is wearing sports bra.  Has some tenderness.   SAFETY ISSUES: Prior radiation? No Pacemaker/ICD? No Possible current pregnancy?  Postmenopausal Is the patient on methotrexate? No  Current Complaints / other details:   Dr. Jeannine Boga Skin surgery- Most recent forehead- malignant melanoma. Bilateral legs, abdomen, back, bottom of foot.

## 2022-07-09 ENCOUNTER — Ambulatory Visit: Payer: Medicare Other

## 2022-07-09 ENCOUNTER — Ambulatory Visit
Admission: RE | Admit: 2022-07-09 | Discharge: 2022-07-09 | Disposition: A | Discharge status: Home / Self Care | Payer: Medicare Other | Source: Ambulatory Visit | Attending: Radiation Oncology | Admitting: Radiation Oncology

## 2022-07-09 ENCOUNTER — Encounter: Payer: Self-pay | Admitting: Radiation Oncology

## 2022-07-09 ENCOUNTER — Ambulatory Visit: Payer: Medicare Other | Admitting: Radiation Oncology

## 2022-07-09 VITALS — BP 124/76 | HR 87 | Temp 97.2°F | Resp 18 | Ht 62.0 in | Wt 137.1 lb

## 2022-07-09 DIAGNOSIS — Z85828 Personal history of other malignant neoplasm of skin: Secondary | ICD-10-CM | POA: Diagnosis not present

## 2022-07-09 DIAGNOSIS — Z801 Family history of malignant neoplasm of trachea, bronchus and lung: Secondary | ICD-10-CM | POA: Diagnosis not present

## 2022-07-09 DIAGNOSIS — E785 Hyperlipidemia, unspecified: Secondary | ICD-10-CM | POA: Diagnosis not present

## 2022-07-09 DIAGNOSIS — Z79899 Other long term (current) drug therapy: Secondary | ICD-10-CM | POA: Diagnosis not present

## 2022-07-09 DIAGNOSIS — D0512 Intraductal carcinoma in situ of left breast: Secondary | ICD-10-CM | POA: Insufficient documentation

## 2022-07-09 DIAGNOSIS — Z170 Estrogen receptor positive status [ER+]: Secondary | ICD-10-CM | POA: Diagnosis not present

## 2022-07-09 DIAGNOSIS — Z803 Family history of malignant neoplasm of breast: Secondary | ICD-10-CM | POA: Diagnosis not present

## 2022-07-09 HISTORY — DX: Unspecified malignant neoplasm of skin, unspecified: C44.90

## 2022-07-09 NOTE — Progress Notes (Signed)
Radiation Oncology         [phone] ________________________________  Name: Kelli Richmond        MRN: [id number]  Date of Service: 07/09/2022 DOB: 10-14-1955  [id number], Rebeca Alert, MD  Erroll Luna, MD     REFERRING PHYSICIAN: Erroll Luna, MD   DIAGNOSIS: The encounter diagnosis was Ductal carcinoma in situ (DCIS) of left breast.   HISTORY OF PRESENT ILLNESS: Kelli Richmond is a 67 y.o. female seen at the request of Dr. Brantley Stage for a new diagnosis of left breast cancer.  The patient had been experiencing bilateral nipple discharge and has history of bilateral implants most recently exchanged in 2015. She had a history of a benign breast biopsy and was being followed with diagnostic mammography in addition to her symptoms of nipple discharge.  Her diagnostic mammogram performed in October 2023  did not show any focal findings.  She continued to have symptoms and an MRI of the breast on 04/20/2022 showed an increase in the previously seen area of the 10 o'clock position of the left breast and no evidence of abnormalities in the right breast.  Again no changes were seen to explain the discharge.  Intact retropectoral saline implants were noted.  She underwent an ultrasound of the left breast and axilla that confirmed no adenopathy and a hypoechoic mass in the 10 o'clock position measuring 1.1 cm.  She underwent a biopsy on 05/12/2022 which showed a papilloma.  She then underwent a left lumpectomy on 06/23/2022 that showed benign breast parenchyma in the specimen with the seed and may have her mild fibrocystic changes.  In addition however and additional lateral posterior margin excision identified low-grade DCIS involving an intraductal papilloma negative for invasive carcinoma.  Her tumor was ER/PR positive.  She is seen to discuss adjuvant radiotherapy.    PREVIOUS RADIATION THERAPY: No   PAST MEDICAL HISTORY:  Past Medical History:  Diagnosis Date   Breast cancer ([place])  06/23/2022   Chest pain    a. cath 3/12: no CAD; b. echo 3/12: EF 60%   Depression    Hyperlipidemia    Migraine headache    PONV (postoperative nausea and vomiting)    Postmenopausal    Skin cancer    Forehead   Syncope 07/10/2010   probably vasovagal       PAST SURGICAL HISTORY: Past Surgical History:  Procedure Laterality Date   APPENDECTOMY     AUGMENTATION MAMMAPLASTY  2015   BREAST BIOPSY Left 12/05/2019   BENIGN   BREAST BIOPSY Left 05/12/2022   [place] LT BREAST BX W LOC DEV 1ST LESION IMG BX SPEC US GUIDE 05/12/2022 [facility]   BREAST BIOPSY  06/22/2022   MM LT RADIOACTIVE SEED LOC MAMMO GUIDE 06/22/2022 [facility]   BREAST LUMPECTOMY WITH RADIOACTIVE SEED LOCALIZATION Left 06/23/2022   Procedure: LEFT BREAST LUMPECTOMY WITH RADIOACTIVE SEED LOCALIZATION;  Surgeon: Erroll Luna, MD;  Location: [facility];  Service: General;  Laterality: Left;   CESAREAN SECTION     REDUCTION MAMMAPLASTY  2015     FAMILY HISTORY:  Family History  Problem Relation Age of Onset   Bradycardia Mother        has a pacemaker   Bladder Cancer Mother    Heart attack Father        CABG   Lymphoma Father    Heart attack Sister 62       twin sister, MI treated with PCI   Breast cancer Maternal  Grandmother    Bone cancer Paternal Grandmother    Lung cancer Paternal Uncle      SOCIAL HISTORY:  reports that she has never smoked. She has never used smokeless tobacco. She reports that she does not drink alcohol and does not use drugs.   ALLERGIES: Patient has no known allergies.   MEDICATIONS:  Current Outpatient Medications  Medication Sig Dispense Refill   DULoxetine (CYMBALTA) 60 MG capsule Take 60 mg by mouth daily.       estradiol (VIVELLE-DOT) 0.05 MG/24HR patch Place 1 patch onto the skin 2 (two) times a week.     rizatriptan (MAXALT-MLT) 10 MG disintegrating tablet Take 10 mg by mouth as needed for migraine.      rosuvastatin (CRESTOR) 10 MG tablet  Take 10 mg by mouth at bedtime.     topiramate (TOPAMAX) 100 MG tablet Take 250 mg by mouth daily.      No current facility-administered medications for this encounter.     REVIEW OF SYSTEMS: On review of systems, the patient reports that she is doing well overall.  She states that she is hopeful to maintain her implants without being compromised but is also open to doing what is most needed for her breast cancer treatment.  She has had some tenderness since her surgery but denies frank pain.  No other complaints are verbalized     PHYSICAL EXAM:  Wt Readings from Last 3 Encounters:  07/09/22 137 lb 2 oz (62.2 kg)  06/23/22 138 lb 0.1 oz (62.6 kg)  10/13/19 139 lb (63 kg)   Temp Readings from Last 3 Encounters:  07/09/22 (!) 97.2 F (36.2 C) (Temporal)  06/23/22 97.8 F (36.6 C) (Oral)  10/18/19 98.5 F (36.9 C) (Oral)   BP Readings from Last 3 Encounters:  07/09/22 124/76  06/23/22 (!) 96/55  10/18/19 134/70   Pulse Readings from Last 3 Encounters:  07/09/22 87  06/23/22 84  10/18/19 71    In general this is a well appearing Caucasian female in no acute distress. She's alert and oriented x4 and appropriate throughout the examination. Cardiopulmonary assessment is negative for acute distress and she exhibits normal effort. Bilateral breast exam is deferred.    ECOG = 1  0 - Asymptomatic (Fully active, able to carry on all predisease activities without restriction)  1 - Symptomatic but completely ambulatory (Restricted in physically strenuous activity but ambulatory and able to carry out work of a light or sedentary nature. For example, light housework, office work)  2 - Symptomatic, <50% in bed during the day (Ambulatory and capable of all self care but unable to carry out any work activities. Up and about more than 50% of waking hours)  3 - Symptomatic, >50% in bed, but not bedbound (Capable of only limited self-care, confined to bed or chair 50% or more of waking  hours)  4 - Bedbound (Completely disabled. Cannot carry on any self-care. Totally confined to bed or chair)  5 - Death   Eustace Pen MM, Creech RH, Tormey DC, et al. [phone]). "Toxicity and response criteria of the [facility]". [place] Oncol. 5 (6): 649-55    LABORATORY DATA:  Lab Results  Component Value Date   WBC 6.4 10/18/2019   HGB 11.1 (L) 10/18/2019   HCT 33.4 (L) 10/18/2019   MCV 97.4 10/18/2019   PLT 262 10/18/2019   Lab Results  Component Value Date   NA 139 10/18/2019   K 3.5 10/18/2019  CL 111 10/18/2019   CO2 20 (L) 10/18/2019   Lab Results  Component Value Date   ALT 66 (H) 10/18/2019   AST 99 (H) 10/18/2019   ALKPHOS 99 10/18/2019   BILITOT 0.7 10/18/2019      RADIOGRAPHY: MM Breast Surgical Specimen  Result Date: 06/23/2022 CLINICAL DATA:  Evaluate surgical specimen following excision of papilloma. EXAM: SPECIMEN RADIOGRAPH OF THE LEFT BREAST COMPARISON:  Previous exam(s). FINDINGS: Status post excision of the LEFT breast. The radioactive seed and RIBBON biopsy marker clip are present. The heart shaped clip is not present in the specimen. IMPRESSION: Specimen radiograph of the LEFT breast. Electronically Signed   By: Margarette Canada M.D.   On: 06/23/2022 11:37  MM LT RADIOACTIVE SEED LOC MAMMO GUIDE  Result Date: 06/22/2022 CLINICAL DATA:  Pre excision localization of a recently biopsied mass in the 10 o'clock position of the left breast with scant fragments suggestive of an intraductal papilloma, marked with a ribbon shaped biopsy marker clip and a previously placed heart shaped biopsy marker clip. EXAM: MAMMOGRAPHIC GUIDED RADIOACTIVE SEED LOCALIZATION OF THE LEFT BREAST COMPARISON:  Previous exam(s). FINDINGS: Patient presents for radioactive seed localization prior to left breast excision. I met with the patient and we discussed the procedure of seed localization including benefits and alternatives. We discussed the high likelihood of a  successful procedure. We discussed the risks of the procedure including infection, bleeding, tissue injury and further surgery. We discussed the low dose of radioactivity involved in the procedure. Informed, written consent was given. The usual time-out protocol was performed immediately prior to the procedure. Using mammographic guidance, sterile technique, 1% lidocaine and an I-125 radioactive seed, recently placed ribbon shaped biopsy marker clip and adjacent heart shaped biopsy marker clip in the 10 o'clock position of the left breast were localized using a medial approach. There was 1 cm of medial displacement of the seed following deployment due to some patient motion during the procedure. The follow-up mammogram images confirm the seed 1 cm medial to the expected location and were marked for Dr. Brantley Stage. Follow-up survey of the patient confirms presence of the radioactive seed. Order number of I-125 seed:  [id number]. Total activity:  0.236 mCi reference Date: 05/29/2022 The patient tolerated the procedure well and was released from the [facility]. She was given instructions regarding seed removal. IMPRESSION: Radioactive seed localization left breast. No apparent complications. Electronically Signed   By: Claudie Revering M.D.   On: 06/22/2022 15:01      IMPRESSION/PLAN: 1. Low-grade, ER/PR positive DCIS of the left breast. Dr. Lisbeth Renshaw discusses the pathology findings and reviews the nature of early breast disease.  She has done well since surgery, and Dr. Lisbeth Renshaw recommends adjuvant external radiotherapy to the breast  to reduce risks of local recurrence followed by antiestrogen therapy. We discussed the risks, benefits, short, and long term effects of radiotherapy, as well as the curative intent, and the patient is interested in proceeding. Dr. Lisbeth Renshaw discusses the delivery and logistics of radiotherapy and anticipates a course of 6 1/2 weeks of radiotherapy (due to her in situ implants) with fields to  include her left breast with deep inspiration breath-hold technique. Written consent is obtained and placed in the chart, a copy was provided to the patient. The patient will be contacted to coordinate treatment planning by our simulation department. She is requesting to finish radiation prior to an upcoming trip the week of 09/20/22 to see her grandson graduate from college.   In a  visit lasting 60 minutes, greater than 50% of the time was spent face to face reviewing her case, as well as in preparation of, discussing, and coordinating the patient's care.  The above documentation reflects my direct findings during this shared patient visit. Please see the separate note by Dr. Lisbeth Renshaw on this date for the remainder of the patient's plan of care.    Carola Rhine, [facility]    **Disclaimer: This note was dictated with voice recognition software. Similar sounding words can inadvertently be transcribed and this note may contain transcription errors which may not have been corrected upon publication of note.**

## 2022-07-14 ENCOUNTER — Encounter: Payer: Self-pay | Admitting: *Deleted

## 2022-07-20 ENCOUNTER — Ambulatory Visit
Admission: RE | Admit: 2022-07-20 | Discharge: 2022-07-20 | Disposition: A | Discharge status: Home / Self Care | Payer: Medicare Other | Source: Ambulatory Visit | Attending: Radiation Oncology | Admitting: Radiation Oncology

## 2022-07-20 ENCOUNTER — Other Ambulatory Visit: Payer: Self-pay

## 2022-07-20 DIAGNOSIS — Z510 Encounter for antineoplastic radiation therapy: Secondary | ICD-10-CM | POA: Insufficient documentation

## 2022-07-20 DIAGNOSIS — D0512 Intraductal carcinoma in situ of left breast: Secondary | ICD-10-CM | POA: Insufficient documentation

## 2022-07-27 ENCOUNTER — Inpatient Hospital Stay (HOSPITAL_BASED_OUTPATIENT_CLINIC_OR_DEPARTMENT_OTHER): Payer: Medicare Other | Admitting: Licensed Clinical Social Worker

## 2022-07-27 ENCOUNTER — Inpatient Hospital Stay: Payer: Medicare Other | Attending: Genetic Counselor

## 2022-07-27 ENCOUNTER — Telehealth: Payer: Self-pay | Admitting: Hematology and Oncology

## 2022-07-27 ENCOUNTER — Inpatient Hospital Stay (HOSPITAL_BASED_OUTPATIENT_CLINIC_OR_DEPARTMENT_OTHER): Payer: Medicare Other | Admitting: Hematology and Oncology

## 2022-07-27 ENCOUNTER — Encounter: Payer: Self-pay | Admitting: Licensed Clinical Social Worker

## 2022-07-27 ENCOUNTER — Other Ambulatory Visit: Payer: Self-pay | Admitting: Licensed Clinical Social Worker

## 2022-07-27 ENCOUNTER — Encounter: Payer: Self-pay | Admitting: *Deleted

## 2022-07-27 VITALS — BP 124/67 | HR 81 | Temp 98.2°F | Resp 16 | Wt 137.5 lb

## 2022-07-27 DIAGNOSIS — Z803 Family history of malignant neoplasm of breast: Secondary | ICD-10-CM

## 2022-07-27 DIAGNOSIS — Z85828 Personal history of other malignant neoplasm of skin: Secondary | ICD-10-CM | POA: Diagnosis not present

## 2022-07-27 DIAGNOSIS — Z9049 Acquired absence of other specified parts of digestive tract: Secondary | ICD-10-CM

## 2022-07-27 DIAGNOSIS — Z807 Family history of other malignant neoplasms of lymphoid, hematopoietic and related tissues: Secondary | ICD-10-CM

## 2022-07-27 DIAGNOSIS — Z8052 Family history of malignant neoplasm of bladder: Secondary | ICD-10-CM | POA: Diagnosis not present

## 2022-07-27 DIAGNOSIS — Z79899 Other long term (current) drug therapy: Secondary | ICD-10-CM | POA: Insufficient documentation

## 2022-07-27 DIAGNOSIS — Z801 Family history of malignant neoplasm of trachea, bronchus and lung: Secondary | ICD-10-CM

## 2022-07-27 DIAGNOSIS — D0512 Intraductal carcinoma in situ of left breast: Secondary | ICD-10-CM | POA: Insufficient documentation

## 2022-07-27 DIAGNOSIS — Z8249 Family history of ischemic heart disease and other diseases of the circulatory system: Secondary | ICD-10-CM

## 2022-07-27 DIAGNOSIS — Z808 Family history of malignant neoplasm of other organs or systems: Secondary | ICD-10-CM

## 2022-07-27 DIAGNOSIS — Z170 Estrogen receptor positive status [ER+]: Secondary | ICD-10-CM

## 2022-07-27 LAB — GENETIC SCREENING ORDER

## 2022-07-27 NOTE — Assessment & Plan Note (Addendum)
05/08/2022: Mild interval enlargement of previously biopsied left breast mass 1.1 cm, biopsy: Intraductal papilloma 06/23/2022 left lumpectomy 06/23/2022: Low-grade DCIS involving intraductal papilloma negative for invasive cancer 7.5 mm ER 90%, PR 100%  Pathology review: I discussed with the patient the difference between DCIS and invasive breast cancer. It is considered a precancerous lesion. DCIS is classified as a 0. It is generally detected through mammograms as calcifications. We discussed the significance of grades and its impact on prognosis. We also discussed the importance of ER and PR receptors and their implications to adjuvant treatment options. Prognosis of DCIS dependence on grade, comedo necrosis. It is anticipated that if not treated, 20-30% of DCIS can develop into invasive breast cancer.  Recommendation: 1. Adjuvant radiation therapy 2. Followed by antiestrogen therapy with tamoxifen 5 years  Tamoxifen counseling: We discussed the risks and benefits of tamoxifen. These include but not limited to insomnia, hot flashes, mood changes, vaginal dryness, and weight gain. Although rare, serious side effects including endometrial cancer, risk of blood clots were also discussed. We strongly believe that the benefits far outweigh the risks. Patient understands these risks and consented to starting treatment. Planned treatment duration is 5 years.  Return to clinic after radiation to start antiestrogen therapy

## 2022-07-27 NOTE — Telephone Encounter (Signed)
Scheduled appointment per 3/18 los. Patient is aware of the made appointment.

## 2022-07-27 NOTE — Progress Notes (Signed)
[place] NOTE  Patient Care Team: Chesley Noon, MD as PCP - General (Family Medicine) Rockwell Germany, RN as Oncology Nurse Navigator Mauro Kaufmann, RN as Oncology Nurse Navigator Erroll Luna, MD as Consulting Physician (General Surgery) Nicholas Lose, MD as Consulting Physician (Hematology and Oncology) Kyung Rudd, MD as Consulting Physician (Radiation Oncology)  CHIEF COMPLAINTS/PURPOSE OF CONSULTATION:  Newly diagnosed breast cancer  HISTORY OF PRESENTING ILLNESS:  Kelli Richmond 67 y.o. female is here because of recent diagnosis of left breast DCIS.  She had a mammogram detected abnormality which on biopsy came back as intraductal papilloma.  She underwent a lumpectomy that revealed a papilloma with a focus of intermediate grade DCIS that was ER/PR positive.  She was sent to [place] for discussion regarding adjuvant treatment options.  I reviewed her records extensively and collaborated the history with the patient.    MEDICAL HISTORY:  Past Medical History:  Diagnosis Date   Breast cancer ([place]) 06/23/2022   Chest pain    a. cath 3/12: no CAD; b. echo 3/12: EF 60%   Depression    Hyperlipidemia    Migraine headache    PONV (postoperative nausea and vomiting)    Postmenopausal    Skin cancer    Forehead   Syncope 07/10/2010   probably vasovagal    SURGICAL HISTORY: Past Surgical History:  Procedure Laterality Date   APPENDECTOMY     AUGMENTATION MAMMAPLASTY  2015   BREAST BIOPSY Left 12/05/2019   BENIGN   BREAST BIOPSY Left 05/12/2022   [place] LT BREAST BX W LOC DEV 1ST LESION IMG BX SPEC US GUIDE 05/12/2022 [facility]   BREAST BIOPSY  06/22/2022   MM LT RADIOACTIVE SEED LOC MAMMO GUIDE 06/22/2022 [facility]   BREAST LUMPECTOMY WITH RADIOACTIVE SEED LOCALIZATION Left 06/23/2022   Procedure: LEFT BREAST LUMPECTOMY WITH RADIOACTIVE SEED LOCALIZATION;  Surgeon: Erroll Luna, MD;  Location: [facility];   Service: General;  Laterality: Left;   CESAREAN SECTION     REDUCTION MAMMAPLASTY  2015    SOCIAL HISTORY: Social History   Socioeconomic History   Marital status: Married    Spouse name: Not on file   Number of children: Not on file   Years of education: Not on file   Highest education level: Not on file  Occupational History   Not on file  Tobacco Use   Smoking status: Never   Smokeless tobacco: Never  Vaping Use   Vaping Use: Never used  Substance and Sexual Activity   Alcohol use: No   Drug use: No   Sexual activity: Not on file  Other Topics Concern   Not on file  Social History Narrative    The patient does not smoke, drink, or use any drugs.     She is a relatively active person and reports working out approximately     3 times a week before she started having these pains.    Social Determinants of Health   Financial Resource Strain: Not on file  Food Insecurity: No Food Insecurity (07/09/2022)   Hunger Vital Sign    Worried About Running Out of Food in the Last Year: Never true    Ran Out of Food in the Last Year: Never true  Transportation Needs: No Transportation Needs (07/09/2022)   PRAPARE - Hydrologist (Medical): No    Lack of Transportation (Non-Medical): No  Physical Activity: Not on file  Stress:  Not on file  Social Connections: Not on file  Intimate Partner Violence: Not At Risk (07/09/2022)   Humiliation, Afraid, Rape, and Kick questionnaire    Fear of Current or Ex-Partner: No    Emotionally Abused: No    Physically Abused: No    Sexually Abused: No    FAMILY HISTORY: Family History  Problem Relation Age of Onset   Bradycardia Mother        has a pacemaker   Bladder Cancer Mother        dx 71s   Heart attack Father        CABG   Lymphoma Father        dx 87, d. 1   Heart attack Sister 76       twin sister, MI treated with PCI   Lung cancer Maternal Uncle        d. 13s, hx smoking   Lung cancer Paternal  Uncle        d. 27s, non smoker   Breast cancer Maternal Grandmother        dx 29s   Bone cancer Paternal Grandmother        dx 63   Lung cancer Paternal Grandfather        dx 30s   Breast cancer Other        pat great aunts x2    ALLERGIES:  has No Known Allergies.  MEDICATIONS:  Current Outpatient Medications  Medication Sig Dispense Refill   DULoxetine (CYMBALTA) 60 MG capsule Take 60 mg by mouth daily.       estradiol (VIVELLE-DOT) 0.05 MG/24HR patch Place 1 patch onto the skin 2 (two) times a week.     rizatriptan (MAXALT-MLT) 10 MG disintegrating tablet Take 10 mg by mouth as needed for migraine.      rosuvastatin (CRESTOR) 10 MG tablet Take 10 mg by mouth at bedtime.     topiramate (TOPAMAX) 100 MG tablet Take 250 mg by mouth daily.      No current facility-administered medications for this visit.    REVIEW OF SYSTEMS:   Constitutional: Denies fevers, chills or abnormal night sweats   All other systems were reviewed with the patient and are negative.  PHYSICAL EXAMINATION: ECOG PERFORMANCE STATUS: 0 - Asymptomatic  Vitals:   07/27/22 1310  BP: 124/67  Pulse: 81  Resp: 16  Temp: 98.2 F (36.8 C)  SpO2: 100%   Filed Weights   07/27/22 1310  Weight: 137 lb 8 oz (62.4 kg)    GENERAL:alert, no distress and comfortable    LABORATORY DATA:  I have reviewed the data as listed Lab Results  Component Value Date   WBC 6.4 10/18/2019   HGB 11.1 (L) 10/18/2019   HCT 33.4 (L) 10/18/2019   MCV 97.4 10/18/2019   PLT 262 10/18/2019   Lab Results  Component Value Date   NA 139 10/18/2019   K 3.5 10/18/2019   CL 111 10/18/2019   CO2 20 (L) 10/18/2019    RADIOGRAPHIC STUDIES: I have personally reviewed the radiological reports and agreed with the findings in the report.  ASSESSMENT AND PLAN:  Ductal carcinoma in situ (DCIS) of left breast 05/08/2022: Mild interval enlargement of previously biopsied left breast mass 1.1 cm, biopsy: Intraductal  papilloma 06/23/2022 left lumpectomy 06/23/2022: Low-grade DCIS involving intraductal papilloma negative for invasive cancer 7.5 mm ER 90%, PR 100%  Pathology review: I discussed with the patient the difference between DCIS and invasive breast cancer. It is  considered a precancerous lesion. DCIS is classified as a 0. It is generally detected through mammograms as calcifications. We discussed the significance of grades and its impact on prognosis. We also discussed the importance of ER and PR receptors and their implications to adjuvant treatment options. Prognosis of DCIS dependence on grade, comedo necrosis. It is anticipated that if not treated, 20-30% of DCIS can develop into invasive breast cancer.  Recommendation: 1. Adjuvant radiation therapy 2. Followed by antiestrogen therapy with tamoxifen 5 years  Tamoxifen counseling: We discussed the risks and benefits of tamoxifen. These include but not limited to insomnia, hot flashes, mood changes, vaginal dryness, and weight gain. Although rare, serious side effects including endometrial cancer, risk of blood clots were also discussed. We strongly believe that the benefits far outweigh the risks. Patient understands these risks and consented to starting treatment. Planned treatment duration is 5 years.  She tells me that she loves sweets and makes pound cakes for her grandchildren.  She will slowly taper off and discontinue the estrogen patch as well as Cymbalta. Return to clinic after radiation to start antiestrogen therapy   All questions were answered. The patient knows to call the clinic with any problems, questions or concerns.    Harriette Ohara, MD 07/27/22

## 2022-07-27 NOTE — Progress Notes (Signed)
REFERRING PROVIDER: Erroll Luna, MD [address] [place],  [place] [address]  PRIMARY PROVIDER:  Chesley Noon, MD  PRIMARY REASON FOR VISIT:  1. Ductal carcinoma in situ (DCIS) of left breast   2. Family history of breast cancer   3. Family history of bladder cancer   4. Family history of lung cancer   5. Family history of lymphoma      HISTORY OF PRESENT ILLNESS:   [name], a 67 y.o. female, was seen for a [facility] cancer genetics consultation at the request of Dr. Brantley Stage due to a personal and family history of cancer.  Kelli Richmond presents to clinic today to discuss the possibility of a hereditary predisposition to cancer, genetic testing, and to further clarify her future cancer risks, as well as potential cancer risks for family members.   CANCER HISTORY:  In 2024, at the age of 62, [name] was diagnosed with DCIS of the left breast. The treatment plan includes lumpectomy which was completed in February 2024. She sees Dr. Lindi Adie today.   RISK FACTORS:  Menarche was at age 22.  First live birth at age 18.  Ovaries intact: no.  Hysterectomy: yes.  Menopausal status: postmenopausal.  Colonoscopy: yes; normal. Mammogram within the last year: yes.  Past Medical History:  Diagnosis Date   Breast cancer ([facility]) 06/23/2022   Chest pain    a. cath 3/12: no CAD; b. echo 3/12: EF 60%   Depression    Hyperlipidemia    Migraine headache    PONV (postoperative nausea and vomiting)    Postmenopausal    Skin cancer    Forehead   Syncope 07/10/2010   probably vasovagal    Past Surgical History:  Procedure Laterality Date   APPENDECTOMY     AUGMENTATION MAMMAPLASTY  2015   BREAST BIOPSY Left 12/05/2019   BENIGN   BREAST BIOPSY Left 05/12/2022   [place] LT BREAST BX W LOC DEV 1ST LESION IMG BX SPEC US GUIDE 05/12/2022 [facility]   BREAST BIOPSY  06/22/2022   MM LT RADIOACTIVE SEED LOC MAMMO GUIDE 06/22/2022 [facility]   BREAST LUMPECTOMY WITH  RADIOACTIVE SEED LOCALIZATION Left 06/23/2022   Procedure: LEFT BREAST LUMPECTOMY WITH RADIOACTIVE SEED LOCALIZATION;  Surgeon: Erroll Luna, MD;  Location: [facility];  Service: General;  Laterality: Left;   CESAREAN SECTION     REDUCTION MAMMAPLASTY  2015    FAMILY HISTORY:  We obtained a detailed, 4-generation family history.  Significant diagnoses are listed below: Family History  Problem Relation Age of Onset   Bradycardia Mother        has a pacemaker   Bladder Cancer Mother        dx 75s   Heart attack Father        CABG   Lymphoma Father        dx 39, d. 30   Heart attack Sister 23       twin sister, MI treated with PCI   Lung cancer Maternal Uncle        d. 65s, hx smoking   Lung cancer Paternal Uncle        d. 46s, non smoker   Breast cancer Maternal Grandmother        dx 4s   Bone cancer Paternal Grandmother        dx 50   Lung cancer Paternal Grandfather        dx 38s   Breast cancer  Other        pat great aunts x2   Kelli Richmond has 1 daughter, 34, and 1 son, 49. She has 1 fraternal twin sister and 1 brother, no cancers.  Kelli Richmond mother had bladder cancer in her 15s and died at 107. Maternal uncle died of lung cancer in his 94s, history of smoking. Maternal grandmother had breast cancer in her 6s.   Kelli Richmond father had lymphoma at 11 and passed from it. Patient's paternal uncle had lung cancer in his 20s, no history of smoking. Paternal grandfather had lung cancer in his 48s. Grandmother had bone cancer in her 25s and had 2 sisters that had breast cancer and 2 brothers that had lung cancer.  Kelli Richmond is unaware of previous family history of genetic testing for hereditary cancer risks. There is no reported Ashkenazi Jewish ancestry. There is no known consanguinity.    GENETIC COUNSELING ASSESSMENT: [name] is a 67 y.o. female with a personal and family history of breast cancer which is somewhat suggestive of a hereditary cancer syndrome  and predisposition to cancer. We, therefore, discussed and recommended the following at today's visit.   DISCUSSION: We discussed that approximately 10% of breast cancer is hereditary. Most cases of hereditary breast cancer are associated with BRCA1/BRCA2 genes, although there are other genes associated with hereditary cancer as well. Cancers and risks are gene specific. We discussed that testing is beneficial for several reasons including knowing about cancer risks, identifying potential screening and risk-reduction options that may be appropriate, and to understand if other family members could be at risk for cancer and allow them to undergo genetic testing.   We reviewed the characteristics, features and inheritance patterns of hereditary cancer syndromes. We also discussed genetic testing, including the appropriate family members to test, the process of testing, insurance coverage and turn-around-time for results. We discussed the implications of a negative, positive and/or variant of uncertain significant result. We recommended [name] pursue genetic testing for the [facility] Multi-Cancer+RNA gene panel.   Based on [name]'s personal and family history of cancer, she meets medical criteria for genetic testing. Despite that she meets criteria, she may still have an out of pocket cost. We discussed that if her out of pocket cost for testing is over $100, the laboratory will call and confirm whether she wants to proceed with testing.  If the out of pocket cost of testing is less than $100 she will be billed by the genetic testing laboratory.   PLAN: After considering the risks, benefits, and limitations, [name] provided informed consent to pursue genetic testing and the blood sample was sent to [facility] for analysis of the Multi-Cancer+RNA panel. Results should be available within approximately 2-3 weeks' time, at which point they will be disclosed by telephone to [name], as will any  additional recommendations warranted by these results. [name] will receive a summary of her genetic counseling visit and a copy of her results once available. This information will also be available in Epic.   Ms. Talon questions were answered to her satisfaction today. Our contact information was provided should additional questions or concerns arise. Thank you for the referral and allowing [place] to share in the care of your patient.   Faith Rogue, MS, [facility] Genetic Counselor [email or web address] Phone: [phone]  The patient was seen for a total of 20 minutes in face-to-face genetic counseling.  Dr. Grayland Ormond was available for discussion regarding this case.   _______________________________________________________________________ For Office  Staff:  Number of people involved in session: 1 Was an Intern/ student involved with case: no

## 2022-07-29 DIAGNOSIS — Z510 Encounter for antineoplastic radiation therapy: Secondary | ICD-10-CM | POA: Diagnosis not present

## 2022-08-03 ENCOUNTER — Ambulatory Visit
Admission: RE | Admit: 2022-08-03 | Discharge: 2022-08-03 | Disposition: A | Discharge status: Home / Self Care | Payer: Medicare Other | Source: Ambulatory Visit | Attending: Radiation Oncology | Admitting: Radiation Oncology

## 2022-08-03 ENCOUNTER — Other Ambulatory Visit: Payer: Self-pay

## 2022-08-03 DIAGNOSIS — Z510 Encounter for antineoplastic radiation therapy: Secondary | ICD-10-CM | POA: Diagnosis not present

## 2022-08-03 LAB — RAD ONC ARIA SESSION SUMMARY
Course Elapsed Days: 0
Plan Fractions Treated to Date: 1
Plan Prescribed Dose Per Fraction: 2 Gy
Plan Total Fractions Prescribed: 25
Plan Total Prescribed Dose: 50 Gy
Reference Point Dosage Given to Date: 2 Gy
Reference Point Session Dosage Given: 2 Gy
Session Number: 1

## 2022-08-04 ENCOUNTER — Other Ambulatory Visit: Payer: Self-pay

## 2022-08-04 ENCOUNTER — Telehealth: Payer: Self-pay | Admitting: Licensed Clinical Social Worker

## 2022-08-04 ENCOUNTER — Ambulatory Visit
Admission: RE | Admit: 2022-08-04 | Discharge: 2022-08-04 | Disposition: A | Discharge status: Home / Self Care | Payer: Medicare Other | Source: Ambulatory Visit | Attending: Radiation Oncology | Admitting: Radiation Oncology

## 2022-08-04 DIAGNOSIS — Z510 Encounter for antineoplastic radiation therapy: Secondary | ICD-10-CM | POA: Diagnosis not present

## 2022-08-04 LAB — RAD ONC ARIA SESSION SUMMARY
Course Elapsed Days: 1
Plan Fractions Treated to Date: 2
Plan Prescribed Dose Per Fraction: 2 Gy
Plan Total Fractions Prescribed: 25
Plan Total Prescribed Dose: 50 Gy
Reference Point Dosage Given to Date: 4 Gy
Reference Point Session Dosage Given: 2 Gy
Session Number: 2

## 2022-08-05 ENCOUNTER — Other Ambulatory Visit: Payer: Self-pay

## 2022-08-05 ENCOUNTER — Ambulatory Visit
Admission: RE | Admit: 2022-08-05 | Discharge: 2022-08-05 | Disposition: A | Discharge status: Home / Self Care | Payer: Medicare Other | Source: Ambulatory Visit | Attending: Radiation Oncology | Admitting: Radiation Oncology

## 2022-08-05 ENCOUNTER — Ambulatory Visit: Payer: Self-pay | Admitting: Licensed Clinical Social Worker

## 2022-08-05 DIAGNOSIS — Z1379 Encounter for other screening for genetic and chromosomal anomalies: Secondary | ICD-10-CM | POA: Insufficient documentation

## 2022-08-05 DIAGNOSIS — Z510 Encounter for antineoplastic radiation therapy: Secondary | ICD-10-CM | POA: Diagnosis not present

## 2022-08-05 LAB — RAD ONC ARIA SESSION SUMMARY
Course Elapsed Days: 2
Plan Fractions Treated to Date: 3
Plan Prescribed Dose Per Fraction: 2 Gy
Plan Total Fractions Prescribed: 25
Plan Total Prescribed Dose: 50 Gy
Reference Point Dosage Given to Date: 6 Gy
Reference Point Session Dosage Given: 2 Gy
Session Number: 3

## 2022-08-05 NOTE — Progress Notes (Signed)
HPI:   Ms. Woehrle was previously seen in the [facility] clinic due to a personal and family history of cancer and concerns regarding a hereditary predisposition to cancer. Please refer to our prior cancer genetics clinic note for more information regarding our discussion, assessment and recommendations, at the time. Ms. Halt recent genetic test results were disclosed to her, as were recommendations warranted by these results. These results and recommendations are discussed in more detail below.  CANCER HISTORY:  Oncology History   No history exists.    FAMILY HISTORY:  We obtained a detailed, 4-generation family history.  Significant diagnoses are listed below: Family History  Problem Relation Age of Onset   Bradycardia Mother        has a pacemaker   Bladder Cancer Mother        dx 62s   Heart attack Father        CABG   Lymphoma Father        dx 91, d. 51   Heart attack Sister 44       twin sister, MI treated with PCI   Lung cancer Maternal Uncle        d. 27s, hx smoking   Lung cancer Paternal Uncle        d. 15s, non smoker   Breast cancer Maternal Grandmother        dx 8s   Bone cancer Paternal Grandmother        dx 31   Lung cancer Paternal Grandfather        dx 38s   Breast cancer Other        pat great aunts x2    Ms. Furtak has 1 daughter, 48, and 1 son, 34. She has 1 fraternal twin sister and 1 brother, no cancers.   Ms. Gilkes mother had bladder cancer in her 21s and died at 24. Maternal uncle died of lung cancer in his 68s, history of smoking. Maternal grandmother had breast cancer in her 57s.    Ms. Heaslip father had lymphoma at 56 and passed from it. Patient's paternal uncle had lung cancer in his 80s, no history of smoking. Paternal grandfather had lung cancer in his 58s. Grandmother had bone cancer in her 59s and had 2 sisters that had breast cancer and 2 brothers that had lung cancer.   Ms. Blaisdell is unaware of previous family history of  genetic testing for hereditary cancer risks. There is no reported Ashkenazi Jewish ancestry. There is no known consanguinity.   GENETIC TEST RESULTS:  The [facility] Multi-Cancer+RNA Panel found no pathogenic mutations.   The Multi-Cancer + RNA Panel offered by [facility] includes sequencing and/or deletion/duplication analysis of the following 70 genes:  AIP*, ALK, APC*, ATM*, AXIN2*, BAP1*, BARD1*, BLM*, BMPR1A*, BRCA1*, BRCA2*, BRIP1*, CDC73*, CDH1*, CDK4, CDKN1B*, CDKN2A, CHEK2*, CTNNA1*, DICER1*, EPCAM, EGFR, FH*, FLCN*, GREM1, HOXB13, KIT, LZTR1, MAX*, MBD4, MEN1*, MET, MITF, MLH1*, MSH2*, MSH3*, MSH6*, MUTYH*, NF1*, NF2*, NTHL1*, PALB2*, PDGFRA, PMS2*, POLD1*, POLE*, POT1*, PRKAR1A*, PTCH1*, PTEN*, RAD51C*, RAD51D*, RB1*, RET, SDHA*, SDHAF2*, SDHB*, SDHC*, SDHD*, SMAD4*, SMARCA4*, SMARCB1*, SMARCE1*, STK11*, SUFU*, TMEM127*, TP53*, TSC1*, TSC2*, VHL*. RNA analysis is performed for * genes.   The test report has been scanned into EPIC and is located under the Molecular Pathology section of the Results Review tab.  A portion of the result report is included below for reference. Genetic testing reported out on 08/03/2022.      Even though a pathogenic variant was not identified, possible  explanations for the cancer in the family may include: There may be no hereditary risk for cancer in the family. The cancers in Ms. Doleman and/or her family may be sporadic/familial or due to other genetic and environmental factors. There may be a gene mutation in one of these genes that current testing methods cannot detect but that chance is small. There could be another gene that has not yet been discovered, or that we have not yet tested, that is responsible for the cancer diagnoses in the family.  It is also possible there is a hereditary cause for the cancer in the family that Ms. Sterkel did not inherit.  Therefore, it is important to remain in touch with cancer genetics in the future so that we can continue to  offer Ms. Takemoto the most up to date genetic testing.   ADDITIONAL GENETIC TESTING:  We discussed with Ms. Petrilla that her genetic testing was fairly extensive.  If there are additional relevant genes identified to increase cancer risk that can be analyzed in the future, we would be happy to discuss and coordinate this testing at that time.    CANCER SCREENING RECOMMENDATIONS:  Ms. Welburn test result is considered negative (normal).  This means that we have not identified a hereditary cause for her personal and family history of cancer at this time.   An individual's cancer risk and medical management are not determined by genetic test results alone. Overall cancer risk assessment incorporates additional factors, including personal medical history, family history, and any available genetic information that may result in a personalized plan for cancer prevention and surveillance. Therefore, it is recommended she continue to follow the cancer management and screening guidelines provided by her oncology and primary healthcare provider.  RECOMMENDATIONS FOR FAMILY MEMBERS:   Since she did not inherit a identifiable mutation in a cancer predisposition gene included on this panel, her children could not have inherited a known mutation from her in one of these genes. Individuals in this family might be at some increased risk of developing cancer, over the general population risk, due to the family history of cancer.  Individuals in the family should notify their providers of the family history of cancer. We recommend women in this family have a yearly mammogram beginning at age 83, or 60 years younger than the earliest onset of cancer, an annual clinical breast exam, and perform monthly breast self-exams.  Family members should have colonoscopies by at age 94, or earlier, as recommended by their providers.  FOLLOW-UP:  Lastly, we discussed with Ms. Troost that cancer genetics is a rapidly advancing field and  it is possible that new genetic tests will be appropriate for her and/or her family members in the future. We encouraged her to remain in contact with cancer genetics on an annual basis so we can update her personal and family histories and let her know of advances in cancer genetics that may benefit this family.   Our contact number was provided. Ms. Lofthouse questions were answered to her satisfaction, and she knows she is welcome to call us at anytime with additional questions or concerns.    Faith Rogue, MS, [facility] Genetic Counselor [email or web address] Phone: [phone]

## 2022-08-05 NOTE — Telephone Encounter (Signed)
I contacted Kelli Richmond to discuss her genetic testing results. No pathogenic variants were identified in the 70 genes analyzed. Detailed clinic note to follow.   The test report has been scanned into EPIC and is located under the Molecular Pathology section of the Results Review tab.  A portion of the result report is included below for reference.      Faith Rogue, MS, [facility] Genetic Counselor [email or web address] Phone: [phone]\

## 2022-08-06 ENCOUNTER — Inpatient Hospital Stay: Payer: Medicare Other | Admitting: Licensed Clinical Social Worker

## 2022-08-06 ENCOUNTER — Other Ambulatory Visit: Payer: Self-pay

## 2022-08-06 ENCOUNTER — Ambulatory Visit
Admission: RE | Admit: 2022-08-06 | Discharge: 2022-08-06 | Disposition: A | Discharge status: Home / Self Care | Payer: Medicare Other | Source: Ambulatory Visit | Attending: Radiation Oncology | Admitting: Radiation Oncology

## 2022-08-06 DIAGNOSIS — Z510 Encounter for antineoplastic radiation therapy: Secondary | ICD-10-CM | POA: Diagnosis not present

## 2022-08-06 LAB — RAD ONC ARIA SESSION SUMMARY
Course Elapsed Days: 3
Plan Fractions Treated to Date: 4
Plan Prescribed Dose Per Fraction: 2 Gy
Plan Total Fractions Prescribed: 25
Plan Total Prescribed Dose: 50 Gy
Reference Point Dosage Given to Date: 8 Gy
Reference Point Session Dosage Given: 2 Gy
Session Number: 4

## 2022-08-06 NOTE — Progress Notes (Signed)
[facility] Work  Initial Assessment   Charmane Mennen is a 67 y.o. year old female contacted by phone. Clinical Social Work was referred by new patient protocol for assessment of psychosocial needs.   SDOH (Social Determinants of Health) assessments performed: Yes   SDOH Screenings   Food Insecurity: No Food Insecurity (07/09/2022)  Housing: Low Risk  (07/09/2022)  Transportation Needs: No Transportation Needs (07/09/2022)  Utilities: Not At Risk (07/09/2022)  Depression (PHQ2-9): Low Risk  (07/09/2022)  Tobacco Use: Low Risk  (07/27/2022)     Distress Screen completed: No     No data to display            Family/Social Information:  Housing Arrangement: patient lives with spouse Family members/support persons in your life? Family, Friends, and Music therapist concerns: no  Financial concerns: No Type of concern: None Food access concerns: no Religious or spiritual practice: Yes-involved in church and has good support there Services Currently in place:  [employer]  Coping/ Adjustment to diagnosis: Patient understands treatment plan and what happens next? yes, undergoing radiation treatment currently Concerns about diagnosis and/or treatment:  Not especially worried- trusting in God Current coping skills/ strengths: Capable of independent living , Armed forces logistics/support/administrative officer , Motivation for treatment/growth , Religious Affiliation , and Supportive family/friends     SUMMARY: Current SDOH Barriers:  No major barriers noted today  Clinical Social Work Clinical Goal(s):  No clinical social work goals at this time  Interventions: Discussed common feeling and emotions when being diagnosed with cancer, and the importance of support during treatment Informed patient of the support team roles and support services at [facility] Provided contact information and encouraged patient to call with any questions or concerns   Follow Up Plan: Patient will contact CSW with any  support or resource needs Patient verbalizes understanding of plan: Yes    Jonnie Truxillo E Iveth Heidemann, LCSW

## 2022-08-07 ENCOUNTER — Other Ambulatory Visit: Payer: Self-pay

## 2022-08-07 ENCOUNTER — Ambulatory Visit
Admission: RE | Admit: 2022-08-07 | Discharge: 2022-08-07 | Disposition: A | Discharge status: Home / Self Care | Payer: Medicare Other | Source: Ambulatory Visit | Attending: Radiation Oncology | Admitting: Radiation Oncology

## 2022-08-07 DIAGNOSIS — Z510 Encounter for antineoplastic radiation therapy: Secondary | ICD-10-CM | POA: Diagnosis not present

## 2022-08-07 DIAGNOSIS — D0512 Intraductal carcinoma in situ of left breast: Secondary | ICD-10-CM

## 2022-08-07 LAB — RAD ONC ARIA SESSION SUMMARY
Course Elapsed Days: 4
Plan Fractions Treated to Date: 5
Plan Prescribed Dose Per Fraction: 2 Gy
Plan Total Fractions Prescribed: 25
Plan Total Prescribed Dose: 50 Gy
Reference Point Dosage Given to Date: 10 Gy
Reference Point Session Dosage Given: 2 Gy
Session Number: 5

## 2022-08-07 MED ORDER — RADIAPLEXRX EX GEL: Freq: Once | CUTANEOUS | Status: AC

## 2022-08-10 ENCOUNTER — Ambulatory Visit
Admission: RE | Admit: 2022-08-10 | Discharge: 2022-08-10 | Disposition: A | Discharge status: Home / Self Care | Payer: Medicare Other | Source: Ambulatory Visit | Attending: Radiation Oncology | Admitting: Radiation Oncology

## 2022-08-10 ENCOUNTER — Other Ambulatory Visit: Payer: Self-pay

## 2022-08-10 DIAGNOSIS — D0512 Intraductal carcinoma in situ of left breast: Secondary | ICD-10-CM | POA: Insufficient documentation

## 2022-08-10 DIAGNOSIS — Z510 Encounter for antineoplastic radiation therapy: Secondary | ICD-10-CM | POA: Diagnosis present

## 2022-08-10 LAB — RAD ONC ARIA SESSION SUMMARY
Course Elapsed Days: 7
Plan Fractions Treated to Date: 6
Plan Prescribed Dose Per Fraction: 2 Gy
Plan Total Fractions Prescribed: 25
Plan Total Prescribed Dose: 50 Gy
Reference Point Dosage Given to Date: 12 Gy
Reference Point Session Dosage Given: 2 Gy
Session Number: 6

## 2022-08-11 ENCOUNTER — Ambulatory Visit
Admission: RE | Admit: 2022-08-11 | Discharge: 2022-08-11 | Disposition: A | Discharge status: Home / Self Care | Payer: Medicare Other | Source: Ambulatory Visit | Attending: Radiation Oncology | Admitting: Radiation Oncology

## 2022-08-11 ENCOUNTER — Other Ambulatory Visit: Payer: Self-pay

## 2022-08-11 DIAGNOSIS — Z510 Encounter for antineoplastic radiation therapy: Secondary | ICD-10-CM | POA: Diagnosis not present

## 2022-08-11 LAB — RAD ONC ARIA SESSION SUMMARY
Course Elapsed Days: 8
Plan Fractions Treated to Date: 7
Plan Prescribed Dose Per Fraction: 2 Gy
Plan Total Fractions Prescribed: 25
Plan Total Prescribed Dose: 50 Gy
Reference Point Dosage Given to Date: 14 Gy
Reference Point Session Dosage Given: 2 Gy
Session Number: 7

## 2022-08-12 ENCOUNTER — Other Ambulatory Visit: Payer: Self-pay

## 2022-08-12 ENCOUNTER — Ambulatory Visit
Admission: RE | Admit: 2022-08-12 | Discharge: 2022-08-12 | Disposition: A | Discharge status: Home / Self Care | Payer: Medicare Other | Source: Ambulatory Visit | Attending: Radiation Oncology | Admitting: Radiation Oncology

## 2022-08-12 DIAGNOSIS — Z510 Encounter for antineoplastic radiation therapy: Secondary | ICD-10-CM | POA: Diagnosis not present

## 2022-08-12 LAB — RAD ONC ARIA SESSION SUMMARY
Course Elapsed Days: 9
Plan Fractions Treated to Date: 8
Plan Prescribed Dose Per Fraction: 2 Gy
Plan Total Fractions Prescribed: 25
Plan Total Prescribed Dose: 50 Gy
Reference Point Dosage Given to Date: 16 Gy
Reference Point Session Dosage Given: 2 Gy
Session Number: 8

## 2022-08-13 ENCOUNTER — Ambulatory Visit
Admission: RE | Admit: 2022-08-13 | Discharge: 2022-08-13 | Disposition: A | Discharge status: Home / Self Care | Payer: Medicare Other | Source: Ambulatory Visit | Attending: Radiation Oncology | Admitting: Radiation Oncology

## 2022-08-13 ENCOUNTER — Other Ambulatory Visit: Payer: Self-pay

## 2022-08-13 DIAGNOSIS — Z510 Encounter for antineoplastic radiation therapy: Secondary | ICD-10-CM | POA: Diagnosis not present

## 2022-08-13 LAB — RAD ONC ARIA SESSION SUMMARY
Course Elapsed Days: 10
Plan Fractions Treated to Date: 9
Plan Prescribed Dose Per Fraction: 2 Gy
Plan Total Fractions Prescribed: 25
Plan Total Prescribed Dose: 50 Gy
Reference Point Dosage Given to Date: 18 Gy
Reference Point Session Dosage Given: 2 Gy
Session Number: 9

## 2022-08-14 ENCOUNTER — Ambulatory Visit
Admission: RE | Admit: 2022-08-14 | Discharge: 2022-08-14 | Disposition: A | Discharge status: Home / Self Care | Payer: Medicare Other | Source: Ambulatory Visit | Attending: Radiation Oncology | Admitting: Radiation Oncology

## 2022-08-14 ENCOUNTER — Other Ambulatory Visit: Payer: Self-pay

## 2022-08-14 DIAGNOSIS — Z510 Encounter for antineoplastic radiation therapy: Secondary | ICD-10-CM | POA: Diagnosis not present

## 2022-08-14 LAB — RAD ONC ARIA SESSION SUMMARY
Course Elapsed Days: 11
Plan Fractions Treated to Date: 10
Plan Prescribed Dose Per Fraction: 2 Gy
Plan Total Fractions Prescribed: 25
Plan Total Prescribed Dose: 50 Gy
Reference Point Dosage Given to Date: 20 Gy
Reference Point Session Dosage Given: 2 Gy
Session Number: 10

## 2022-08-17 ENCOUNTER — Ambulatory Visit
Admission: RE | Admit: 2022-08-17 | Discharge: 2022-08-17 | Disposition: A | Discharge status: Home / Self Care | Payer: Medicare Other | Source: Ambulatory Visit | Attending: Radiation Oncology | Admitting: Radiation Oncology

## 2022-08-17 ENCOUNTER — Other Ambulatory Visit: Payer: Self-pay

## 2022-08-17 DIAGNOSIS — Z510 Encounter for antineoplastic radiation therapy: Secondary | ICD-10-CM | POA: Diagnosis not present

## 2022-08-17 LAB — RAD ONC ARIA SESSION SUMMARY
Course Elapsed Days: 14
Plan Fractions Treated to Date: 11
Plan Prescribed Dose Per Fraction: 2 Gy
Plan Total Fractions Prescribed: 25
Plan Total Prescribed Dose: 50 Gy
Reference Point Dosage Given to Date: 22 Gy
Reference Point Session Dosage Given: 2 Gy
Session Number: 11

## 2022-08-18 ENCOUNTER — Ambulatory Visit
Admission: RE | Admit: 2022-08-18 | Discharge: 2022-08-18 | Disposition: A | Discharge status: Home / Self Care | Payer: Medicare Other | Source: Ambulatory Visit | Attending: Radiation Oncology | Admitting: Radiation Oncology

## 2022-08-18 ENCOUNTER — Other Ambulatory Visit: Payer: Self-pay

## 2022-08-18 DIAGNOSIS — Z510 Encounter for antineoplastic radiation therapy: Secondary | ICD-10-CM | POA: Diagnosis not present

## 2022-08-18 LAB — RAD ONC ARIA SESSION SUMMARY
Course Elapsed Days: 15
Plan Fractions Treated to Date: 12
Plan Prescribed Dose Per Fraction: 2 Gy
Plan Total Fractions Prescribed: 25
Plan Total Prescribed Dose: 50 Gy
Reference Point Dosage Given to Date: 24 Gy
Reference Point Session Dosage Given: 2 Gy
Session Number: 12

## 2022-08-19 ENCOUNTER — Other Ambulatory Visit: Payer: Self-pay

## 2022-08-19 ENCOUNTER — Ambulatory Visit
Admission: RE | Admit: 2022-08-19 | Discharge: 2022-08-19 | Disposition: A | Discharge status: Home / Self Care | Payer: Medicare Other | Source: Ambulatory Visit | Attending: Radiation Oncology | Admitting: Radiation Oncology

## 2022-08-19 DIAGNOSIS — Z510 Encounter for antineoplastic radiation therapy: Secondary | ICD-10-CM | POA: Diagnosis not present

## 2022-08-19 LAB — RAD ONC ARIA SESSION SUMMARY
Course Elapsed Days: 16
Plan Fractions Treated to Date: 13
Plan Prescribed Dose Per Fraction: 2 Gy
Plan Total Fractions Prescribed: 25
Plan Total Prescribed Dose: 50 Gy
Reference Point Dosage Given to Date: 26 Gy
Reference Point Session Dosage Given: 2 Gy
Session Number: 13

## 2022-08-20 ENCOUNTER — Other Ambulatory Visit: Payer: Self-pay

## 2022-08-20 ENCOUNTER — Ambulatory Visit
Admission: RE | Admit: 2022-08-20 | Discharge: 2022-08-20 | Disposition: A | Discharge status: Home / Self Care | Payer: Medicare Other | Source: Ambulatory Visit | Attending: Radiation Oncology | Admitting: Radiation Oncology

## 2022-08-20 DIAGNOSIS — Z510 Encounter for antineoplastic radiation therapy: Secondary | ICD-10-CM | POA: Diagnosis not present

## 2022-08-20 LAB — RAD ONC ARIA SESSION SUMMARY
Course Elapsed Days: 17
Plan Fractions Treated to Date: 14
Plan Prescribed Dose Per Fraction: 2 Gy
Plan Total Fractions Prescribed: 25
Plan Total Prescribed Dose: 50 Gy
Reference Point Dosage Given to Date: 28 Gy
Reference Point Session Dosage Given: 2 Gy
Session Number: 14

## 2022-08-21 ENCOUNTER — Other Ambulatory Visit: Payer: Self-pay

## 2022-08-21 ENCOUNTER — Ambulatory Visit
Admission: RE | Admit: 2022-08-21 | Discharge: 2022-08-21 | Disposition: A | Discharge status: Home / Self Care | Payer: Medicare Other | Source: Ambulatory Visit | Attending: Radiation Oncology | Admitting: Radiation Oncology

## 2022-08-21 DIAGNOSIS — Z510 Encounter for antineoplastic radiation therapy: Secondary | ICD-10-CM | POA: Diagnosis not present

## 2022-08-21 LAB — RAD ONC ARIA SESSION SUMMARY
Course Elapsed Days: 18
Plan Fractions Treated to Date: 15
Plan Prescribed Dose Per Fraction: 2 Gy
Plan Total Fractions Prescribed: 25
Plan Total Prescribed Dose: 50 Gy
Reference Point Dosage Given to Date: 30 Gy
Reference Point Session Dosage Given: 2 Gy
Session Number: 15

## 2022-08-24 ENCOUNTER — Other Ambulatory Visit: Payer: Self-pay

## 2022-08-24 ENCOUNTER — Ambulatory Visit
Admission: RE | Admit: 2022-08-24 | Discharge: 2022-08-24 | Disposition: A | Discharge status: Home / Self Care | Payer: Medicare Other | Source: Ambulatory Visit | Attending: Radiation Oncology | Admitting: Radiation Oncology

## 2022-08-24 DIAGNOSIS — Z510 Encounter for antineoplastic radiation therapy: Secondary | ICD-10-CM | POA: Diagnosis not present

## 2022-08-24 LAB — RAD ONC ARIA SESSION SUMMARY
Course Elapsed Days: 21
Plan Fractions Treated to Date: 16
Plan Prescribed Dose Per Fraction: 2 Gy
Plan Total Fractions Prescribed: 25
Plan Total Prescribed Dose: 50 Gy
Reference Point Dosage Given to Date: 32 Gy
Reference Point Session Dosage Given: 2 Gy
Session Number: 16

## 2022-08-25 ENCOUNTER — Other Ambulatory Visit: Payer: Self-pay

## 2022-08-25 ENCOUNTER — Ambulatory Visit
Admission: RE | Admit: 2022-08-25 | Discharge: 2022-08-25 | Disposition: A | Discharge status: Home / Self Care | Payer: Medicare Other | Source: Ambulatory Visit | Attending: Radiation Oncology | Admitting: Radiation Oncology

## 2022-08-25 DIAGNOSIS — Z510 Encounter for antineoplastic radiation therapy: Secondary | ICD-10-CM | POA: Diagnosis not present

## 2022-08-25 LAB — RAD ONC ARIA SESSION SUMMARY
Course Elapsed Days: 22
Plan Fractions Treated to Date: 17
Plan Prescribed Dose Per Fraction: 2 Gy
Plan Total Fractions Prescribed: 25
Plan Total Prescribed Dose: 50 Gy
Reference Point Dosage Given to Date: 34 Gy
Reference Point Session Dosage Given: 2 Gy
Session Number: 17

## 2022-08-26 ENCOUNTER — Ambulatory Visit
Admission: RE | Admit: 2022-08-26 | Discharge: 2022-08-26 | Disposition: A | Discharge status: Home / Self Care | Payer: Medicare Other | Source: Ambulatory Visit | Attending: Radiation Oncology | Admitting: Radiation Oncology

## 2022-08-26 ENCOUNTER — Other Ambulatory Visit: Payer: Self-pay

## 2022-08-26 DIAGNOSIS — Z510 Encounter for antineoplastic radiation therapy: Secondary | ICD-10-CM | POA: Diagnosis not present

## 2022-08-26 LAB — RAD ONC ARIA SESSION SUMMARY
Course Elapsed Days: 23
Plan Fractions Treated to Date: 18
Plan Prescribed Dose Per Fraction: 2 Gy
Plan Total Fractions Prescribed: 25
Plan Total Prescribed Dose: 50 Gy
Reference Point Dosage Given to Date: 36 Gy
Reference Point Session Dosage Given: 2 Gy
Session Number: 18

## 2022-08-27 ENCOUNTER — Ambulatory Visit
Admission: RE | Admit: 2022-08-27 | Discharge: 2022-08-27 | Disposition: A | Discharge status: Home / Self Care | Payer: Medicare Other | Source: Ambulatory Visit | Attending: Radiation Oncology | Admitting: Radiation Oncology

## 2022-08-27 ENCOUNTER — Other Ambulatory Visit: Payer: Self-pay

## 2022-08-27 DIAGNOSIS — Z510 Encounter for antineoplastic radiation therapy: Secondary | ICD-10-CM | POA: Diagnosis not present

## 2022-08-27 LAB — RAD ONC ARIA SESSION SUMMARY
Course Elapsed Days: 24
Plan Fractions Treated to Date: 19
Plan Prescribed Dose Per Fraction: 2 Gy
Plan Total Fractions Prescribed: 25
Plan Total Prescribed Dose: 50 Gy
Reference Point Dosage Given to Date: 38 Gy
Reference Point Session Dosage Given: 2 Gy
Session Number: 19

## 2022-08-28 ENCOUNTER — Ambulatory Visit
Admission: RE | Admit: 2022-08-28 | Discharge: 2022-08-28 | Disposition: A | Discharge status: Home / Self Care | Payer: Medicare Other | Source: Ambulatory Visit | Attending: Radiation Oncology | Admitting: Radiation Oncology

## 2022-08-28 ENCOUNTER — Other Ambulatory Visit: Payer: Self-pay

## 2022-08-28 DIAGNOSIS — Z510 Encounter for antineoplastic radiation therapy: Secondary | ICD-10-CM | POA: Diagnosis not present

## 2022-08-28 LAB — RAD ONC ARIA SESSION SUMMARY
Course Elapsed Days: 25
Plan Fractions Treated to Date: 20
Plan Prescribed Dose Per Fraction: 2 Gy
Plan Total Fractions Prescribed: 25
Plan Total Prescribed Dose: 50 Gy
Reference Point Dosage Given to Date: 40 Gy
Reference Point Session Dosage Given: 2 Gy
Session Number: 20

## 2022-08-31 ENCOUNTER — Other Ambulatory Visit: Payer: Self-pay

## 2022-08-31 ENCOUNTER — Ambulatory Visit
Admission: RE | Admit: 2022-08-31 | Discharge: 2022-08-31 | Disposition: A | Discharge status: Home / Self Care | Payer: Medicare Other | Source: Ambulatory Visit | Attending: Radiation Oncology | Admitting: Radiation Oncology

## 2022-08-31 DIAGNOSIS — Z510 Encounter for antineoplastic radiation therapy: Secondary | ICD-10-CM | POA: Diagnosis not present

## 2022-08-31 LAB — RAD ONC ARIA SESSION SUMMARY
Course Elapsed Days: 28
Plan Fractions Treated to Date: 21
Plan Prescribed Dose Per Fraction: 2 Gy
Plan Total Fractions Prescribed: 25
Plan Total Prescribed Dose: 50 Gy
Reference Point Dosage Given to Date: 42 Gy
Reference Point Session Dosage Given: 2 Gy
Session Number: 21

## 2022-09-01 ENCOUNTER — Ambulatory Visit
Admission: RE | Admit: 2022-09-01 | Discharge: 2022-09-01 | Disposition: A | Discharge status: Home / Self Care | Payer: Medicare Other | Source: Ambulatory Visit | Attending: Radiation Oncology | Admitting: Radiation Oncology

## 2022-09-01 ENCOUNTER — Other Ambulatory Visit: Payer: Self-pay

## 2022-09-01 DIAGNOSIS — Z510 Encounter for antineoplastic radiation therapy: Secondary | ICD-10-CM | POA: Diagnosis not present

## 2022-09-01 LAB — RAD ONC ARIA SESSION SUMMARY
Course Elapsed Days: 29
Plan Fractions Treated to Date: 22
Plan Prescribed Dose Per Fraction: 2 Gy
Plan Total Fractions Prescribed: 25
Plan Total Prescribed Dose: 50 Gy
Reference Point Dosage Given to Date: 44 Gy
Reference Point Session Dosage Given: 2 Gy
Session Number: 22

## 2022-09-02 ENCOUNTER — Ambulatory Visit
Admission: RE | Admit: 2022-09-02 | Discharge: 2022-09-02 | Disposition: A | Discharge status: Home / Self Care | Payer: Medicare Other | Source: Ambulatory Visit | Attending: Radiation Oncology | Admitting: Radiation Oncology

## 2022-09-02 ENCOUNTER — Other Ambulatory Visit: Payer: Self-pay

## 2022-09-02 DIAGNOSIS — Z510 Encounter for antineoplastic radiation therapy: Secondary | ICD-10-CM | POA: Diagnosis not present

## 2022-09-02 LAB — RAD ONC ARIA SESSION SUMMARY
Course Elapsed Days: 30
Plan Fractions Treated to Date: 23
Plan Prescribed Dose Per Fraction: 2 Gy
Plan Total Fractions Prescribed: 25
Plan Total Prescribed Dose: 50 Gy
Reference Point Dosage Given to Date: 46 Gy
Reference Point Session Dosage Given: 2 Gy
Session Number: 23

## 2022-09-03 ENCOUNTER — Ambulatory Visit
Admission: RE | Admit: 2022-09-03 | Discharge: 2022-09-03 | Disposition: A | Discharge status: Home / Self Care | Payer: Medicare Other | Source: Ambulatory Visit | Attending: Radiation Oncology | Admitting: Radiation Oncology

## 2022-09-03 ENCOUNTER — Other Ambulatory Visit: Payer: Self-pay

## 2022-09-03 DIAGNOSIS — Z510 Encounter for antineoplastic radiation therapy: Secondary | ICD-10-CM | POA: Diagnosis not present

## 2022-09-03 LAB — RAD ONC ARIA SESSION SUMMARY
Course Elapsed Days: 31
Plan Fractions Treated to Date: 24
Plan Prescribed Dose Per Fraction: 2 Gy
Plan Total Fractions Prescribed: 25
Plan Total Prescribed Dose: 50 Gy
Reference Point Dosage Given to Date: 48 Gy
Reference Point Session Dosage Given: 2 Gy
Session Number: 24

## 2022-09-04 ENCOUNTER — Other Ambulatory Visit: Payer: Self-pay

## 2022-09-04 ENCOUNTER — Ambulatory Visit
Admission: RE | Admit: 2022-09-04 | Discharge: 2022-09-04 | Disposition: A | Discharge status: Home / Self Care | Payer: Medicare Other | Source: Ambulatory Visit | Attending: Radiation Oncology | Admitting: Radiation Oncology

## 2022-09-04 DIAGNOSIS — Z510 Encounter for antineoplastic radiation therapy: Secondary | ICD-10-CM | POA: Diagnosis not present

## 2022-09-04 LAB — RAD ONC ARIA SESSION SUMMARY
Course Elapsed Days: 32
Plan Fractions Treated to Date: 25
Plan Prescribed Dose Per Fraction: 2 Gy
Plan Total Fractions Prescribed: 25
Plan Total Prescribed Dose: 50 Gy
Reference Point Dosage Given to Date: 50 Gy
Reference Point Session Dosage Given: 2 Gy
Session Number: 25

## 2022-09-07 ENCOUNTER — Other Ambulatory Visit: Payer: Self-pay

## 2022-09-07 ENCOUNTER — Ambulatory Visit: Payer: Medicare Other

## 2022-09-07 DIAGNOSIS — Z510 Encounter for antineoplastic radiation therapy: Secondary | ICD-10-CM | POA: Diagnosis not present

## 2022-09-07 LAB — RAD ONC ARIA SESSION SUMMARY
Course Elapsed Days: 35
Plan Fractions Treated to Date: 1
Plan Prescribed Dose Per Fraction: 1.8 Gy
Plan Total Fractions Prescribed: 8
Plan Total Prescribed Dose: 14.4 Gy
Reference Point Dosage Given to Date: 1.8 Gy
Reference Point Session Dosage Given: 1.8 Gy
Session Number: 26

## 2022-09-08 ENCOUNTER — Ambulatory Visit
Admission: RE | Admit: 2022-09-08 | Discharge: 2022-09-08 | Disposition: A | Discharge status: Home / Self Care | Payer: Medicare Other | Source: Ambulatory Visit | Attending: Radiation Oncology | Admitting: Radiation Oncology

## 2022-09-08 ENCOUNTER — Other Ambulatory Visit: Payer: Self-pay

## 2022-09-08 DIAGNOSIS — Z510 Encounter for antineoplastic radiation therapy: Secondary | ICD-10-CM | POA: Diagnosis not present

## 2022-09-08 LAB — RAD ONC ARIA SESSION SUMMARY
Course Elapsed Days: 36
Plan Fractions Treated to Date: 2
Plan Prescribed Dose Per Fraction: 1.8 Gy
Plan Total Fractions Prescribed: 8
Plan Total Prescribed Dose: 14.4 Gy
Reference Point Dosage Given to Date: 3.6 Gy
Reference Point Session Dosage Given: 1.8 Gy
Session Number: 27

## 2022-09-09 ENCOUNTER — Ambulatory Visit
Admission: RE | Admit: 2022-09-09 | Discharge: 2022-09-09 | Disposition: A | Discharge status: Home / Self Care | Payer: Medicare Other | Source: Ambulatory Visit | Attending: Radiation Oncology | Admitting: Radiation Oncology

## 2022-09-09 ENCOUNTER — Other Ambulatory Visit: Payer: Self-pay

## 2022-09-09 DIAGNOSIS — Z510 Encounter for antineoplastic radiation therapy: Secondary | ICD-10-CM | POA: Insufficient documentation

## 2022-09-09 DIAGNOSIS — D0512 Intraductal carcinoma in situ of left breast: Secondary | ICD-10-CM | POA: Insufficient documentation

## 2022-09-09 LAB — RAD ONC ARIA SESSION SUMMARY
Course Elapsed Days: 37
Plan Fractions Treated to Date: 3
Plan Prescribed Dose Per Fraction: 1.8 Gy
Plan Total Fractions Prescribed: 8
Plan Total Prescribed Dose: 14.4 Gy
Reference Point Dosage Given to Date: 5.4 Gy
Reference Point Session Dosage Given: 1.8 Gy
Session Number: 28

## 2022-09-10 ENCOUNTER — Ambulatory Visit: Payer: Medicare Other

## 2022-09-10 ENCOUNTER — Other Ambulatory Visit: Payer: Self-pay

## 2022-09-10 ENCOUNTER — Ambulatory Visit
Admission: RE | Admit: 2022-09-10 | Discharge: 2022-09-10 | Disposition: A | Discharge status: Home / Self Care | Payer: Medicare Other | Source: Ambulatory Visit | Attending: Radiation Oncology | Admitting: Radiation Oncology

## 2022-09-10 DIAGNOSIS — Z510 Encounter for antineoplastic radiation therapy: Secondary | ICD-10-CM | POA: Diagnosis not present

## 2022-09-10 LAB — RAD ONC ARIA SESSION SUMMARY
Course Elapsed Days: 38
Plan Fractions Treated to Date: 4
Plan Prescribed Dose Per Fraction: 1.8 Gy
Plan Total Fractions Prescribed: 8
Plan Total Prescribed Dose: 14.4 Gy
Reference Point Dosage Given to Date: 7.2 Gy
Reference Point Session Dosage Given: 1.8 Gy
Session Number: 29

## 2022-09-11 ENCOUNTER — Other Ambulatory Visit: Payer: Self-pay

## 2022-09-11 ENCOUNTER — Ambulatory Visit
Admission: RE | Admit: 2022-09-11 | Discharge: 2022-09-11 | Disposition: A | Discharge status: Home / Self Care | Payer: Medicare Other | Source: Ambulatory Visit | Attending: Radiation Oncology | Admitting: Radiation Oncology

## 2022-09-11 DIAGNOSIS — Z510 Encounter for antineoplastic radiation therapy: Secondary | ICD-10-CM | POA: Diagnosis not present

## 2022-09-11 LAB — RAD ONC ARIA SESSION SUMMARY
Course Elapsed Days: 39
Plan Fractions Treated to Date: 5
Plan Prescribed Dose Per Fraction: 1.8 Gy
Plan Total Fractions Prescribed: 8
Plan Total Prescribed Dose: 14.4 Gy
Reference Point Dosage Given to Date: 9 Gy
Reference Point Session Dosage Given: 1.8 Gy
Session Number: 30

## 2022-09-12 NOTE — Progress Notes (Signed)
Patient Care Team: Eartha Inch, MD as PCP - General (Family Medicine) Donnelly Angelica, RN as Oncology Nurse Navigator Lu Duffel, Margretta Ditty, RN as Oncology Nurse Navigator Harriette Bouillon, MD as Consulting Physician (General Surgery) Serena Croissant, MD as Consulting Physician (Hematology and Oncology) Dorothy Puffer, MD as Consulting Physician (Radiation Oncology)  DIAGNOSIS:  Encounter Diagnosis  Name Primary?   Ductal carcinoma in situ (DCIS) of left breast Yes      CHIEF COMPLIANT: Follow-up after radiation has been completed  INTERVAL HISTORY: Kelli Richmond is a 67 y.o. female is here because of recent diagnosis of left breast DCIS. She presents to the clinic for a follow-up After completion of radiation.  She has profound radiation related dermatitis.  She was unable to come off estrogen patch because it gave her severe headaches.  She had some withdrawal symptoms.    ALLERGIES:  has No Known Allergies.  MEDICATIONS:  Current Outpatient Medications  Medication Sig Dispense Refill   DULoxetine (CYMBALTA) 60 MG capsule Take 60 mg by mouth daily.       estradiol (VIVELLE-DOT) 0.05 MG/24HR patch Place 1 patch onto the skin 2 (two) times a week.     rizatriptan (MAXALT-MLT) 10 MG disintegrating tablet Take 10 mg by mouth as needed for migraine.      rosuvastatin (CRESTOR) 10 MG tablet Take 10 mg by mouth at bedtime.     topiramate (TOPAMAX) 100 MG tablet Take 250 mg by mouth daily.      No current facility-administered medications for this visit.    PHYSICAL EXAMINATION: ECOG PERFORMANCE STATUS: 1 - Symptomatic but completely ambulatory  Vitals:   09/18/22 1011  BP: 121/69  Pulse: 92  Resp: 17  Temp: 97.7 F (36.5 C)  SpO2: 100%   Filed Weights   09/18/22 1011  Weight: 138 lb 14.4 oz (63 kg)      LABORATORY DATA:  I have reviewed the data as listed    Latest Ref Rng & Units 10/18/2019    2:28 AM 10/17/2019    4:31 AM 10/16/2019   11:11 AM  CMP  Glucose  70 - 99 mg/dL 161  096  75   BUN 8 - 23 mg/dL 11  11  13    Creatinine 0.44 - 1.00 mg/dL 0.45  4.09  8.11   Sodium 135 - 145 mmol/L 139  142  144   Potassium 3.5 - 5.1 mmol/L 3.5  3.8  3.0   Chloride 98 - 111 mmol/L 111  111  113   CO2 22 - 32 mmol/L 20  21  22    Calcium 8.9 - 10.3 mg/dL 7.8  8.1  8.0   Total Protein 6.5 - 8.1 g/dL 5.3  5.5  5.7   Total Bilirubin 0.3 - 1.2 mg/dL 0.7  0.3  0.2   Alkaline Phos 38 - 126 U/L 99  88  58   AST 15 - 41 U/L 99  61  46   ALT 0 - 44 U/L 66  30  20     Lab Results  Component Value Date   WBC 6.4 10/18/2019   HGB 11.1 (L) 10/18/2019   HCT 33.4 (L) 10/18/2019   MCV 97.4 10/18/2019   PLT 262 10/18/2019   NEUTROABS 5.1 10/18/2019    ASSESSMENT & PLAN:  Ductal carcinoma in situ (DCIS) of left breast 05/08/2022: Mild interval enlargement of previously biopsied left breast mass 1.1 cm, biopsy: Intraductal papilloma 06/23/2022 left lumpectomy 06/23/2022: Low-grade DCIS involving  intraductal papilloma negative for invasive cancer 7.5 mm ER 90%, PR 100% 09/16/2022: Completed adjuvant radiation  Treatment plan: Antiestrogen therapy with tamoxifen x 5 years.  Recommended 10 mg daily. Tamoxifen counseling: We discussed the risks and benefits of tamoxifen.  She was not able to stop her estrogen because it caused severe headaches.  Therefore she is not ready to start tamoxifen.   She will consider slowly tapering tamoxifen over the next year or so.  We will consider starting tamoxifen once she comes off her estrogen and stays in a stable condition.  Breast cancer surveillance: Mammograms were done in October 2024.  Breast MRIs will be done in April 2025.  Return to clinic in 1 year for follow-up.    Orders Placed This Encounter  Procedures   MM DIAG BREAST TOMO BILATERAL    Standing Status:   Future    Standing Expiration Date:   09/18/2023    Order Specific Question:   Reason for Exam (SYMPTOM  OR DIAGNOSIS REQUIRED)    Answer:   Annual mammograms  with H/O breast cancer    Order Specific Question:   Preferred imaging location?    Answer:   [facility]    Order Specific Question:   Release to patient    Answer:   Immediate   The patient has a good understanding of the overall plan. she agrees with it. she will call with any problems that may develop before the next visit here. Total time spent: 30 mins including face to face time and time spent for planning, charting and co-ordination of care   [name], MD 09/18/22    I Janan Ridge am acting as a Neurosurgeon for [employer]  I have reviewed the above documentation for accuracy and completeness, and I agree with the above.

## 2022-09-14 ENCOUNTER — Ambulatory Visit
Admission: RE | Admit: 2022-09-14 | Discharge: 2022-09-14 | Disposition: A | Discharge status: Home / Self Care | Payer: Medicare Other | Source: Ambulatory Visit | Attending: Radiation Oncology | Admitting: Radiation Oncology

## 2022-09-14 ENCOUNTER — Other Ambulatory Visit: Payer: Self-pay

## 2022-09-14 DIAGNOSIS — Z510 Encounter for antineoplastic radiation therapy: Secondary | ICD-10-CM | POA: Diagnosis not present

## 2022-09-14 LAB — RAD ONC ARIA SESSION SUMMARY
Course Elapsed Days: 42
Plan Fractions Treated to Date: 6
Plan Prescribed Dose Per Fraction: 1.8 Gy
Plan Total Fractions Prescribed: 8
Plan Total Prescribed Dose: 14.4 Gy
Reference Point Dosage Given to Date: 10.8 Gy
Reference Point Session Dosage Given: 1.8 Gy
Session Number: 31

## 2022-09-15 ENCOUNTER — Ambulatory Visit
Admission: RE | Admit: 2022-09-15 | Discharge: 2022-09-15 | Disposition: A | Discharge status: Home / Self Care | Payer: Medicare Other | Source: Ambulatory Visit | Attending: Radiation Oncology | Admitting: Radiation Oncology

## 2022-09-15 ENCOUNTER — Other Ambulatory Visit: Payer: Self-pay

## 2022-09-15 DIAGNOSIS — Z510 Encounter for antineoplastic radiation therapy: Secondary | ICD-10-CM | POA: Diagnosis not present

## 2022-09-15 LAB — RAD ONC ARIA SESSION SUMMARY
Course Elapsed Days: 43
Plan Fractions Treated to Date: 7
Plan Prescribed Dose Per Fraction: 1.8 Gy
Plan Total Fractions Prescribed: 8
Plan Total Prescribed Dose: 14.4 Gy
Reference Point Dosage Given to Date: 12.6 Gy
Reference Point Session Dosage Given: 1.8 Gy
Session Number: 32

## 2022-09-16 ENCOUNTER — Ambulatory Visit
Admission: RE | Admit: 2022-09-16 | Discharge: 2022-09-16 | Disposition: A | Discharge status: Home / Self Care | Payer: Medicare Other | Source: Ambulatory Visit | Attending: Radiation Oncology | Admitting: Radiation Oncology

## 2022-09-16 ENCOUNTER — Other Ambulatory Visit: Payer: Self-pay

## 2022-09-16 DIAGNOSIS — Z510 Encounter for antineoplastic radiation therapy: Secondary | ICD-10-CM | POA: Diagnosis not present

## 2022-09-16 LAB — RAD ONC ARIA SESSION SUMMARY
Course Elapsed Days: 44
Plan Fractions Treated to Date: 8
Plan Prescribed Dose Per Fraction: 1.8 Gy
Plan Total Fractions Prescribed: 8
Plan Total Prescribed Dose: 14.4 Gy
Reference Point Dosage Given to Date: 14.4 Gy
Reference Point Session Dosage Given: 1.8 Gy
Session Number: 33

## 2022-09-17 ENCOUNTER — Telehealth: Payer: Self-pay | Admitting: Hematology and Oncology

## 2022-09-17 NOTE — Telephone Encounter (Signed)
Patient called to reschedule tomorrow's appointment with Dr. Pamelia Hoit. Forwarded call to scheduling and sent message.

## 2022-09-18 ENCOUNTER — Inpatient Hospital Stay: Payer: Medicare Other | Attending: Genetic Counselor | Admitting: Hematology and Oncology

## 2022-09-18 ENCOUNTER — Encounter: Payer: Self-pay | Admitting: *Deleted

## 2022-09-18 ENCOUNTER — Other Ambulatory Visit: Payer: Self-pay

## 2022-09-18 VITALS — BP 121/69 | HR 92 | Temp 97.7°F | Resp 17 | Wt 138.9 lb

## 2022-09-18 DIAGNOSIS — D0512 Intraductal carcinoma in situ of left breast: Secondary | ICD-10-CM

## 2022-09-18 DIAGNOSIS — Z923 Personal history of irradiation: Secondary | ICD-10-CM | POA: Insufficient documentation

## 2022-09-18 DIAGNOSIS — R519 Headache, unspecified: Secondary | ICD-10-CM | POA: Insufficient documentation

## 2022-09-18 DIAGNOSIS — Z803 Family history of malignant neoplasm of breast: Secondary | ICD-10-CM

## 2022-09-18 DIAGNOSIS — Z79899 Other long term (current) drug therapy: Secondary | ICD-10-CM | POA: Diagnosis not present

## 2022-09-18 NOTE — Assessment & Plan Note (Addendum)
05/08/2022: Mild interval enlargement of previously biopsied left breast mass 1.1 cm, biopsy: Intraductal papilloma 06/23/2022 left lumpectomy 06/23/2022: Low-grade DCIS involving intraductal papilloma negative for invasive cancer 7.5 mm ER 90%, PR 100% 09/16/2022: Completed adjuvant radiation  Treatment plan: Antiestrogen therapy with tamoxifen x 5 years.  Recommended 10 mg daily. Tamoxifen counseling: We discussed the risks and benefits of tamoxifen.  She was not able to stop her estrogen because it caused severe headaches.  Therefore she is not ready to start tamoxifen.   She will consider slowly tapering tamoxifen over the next year or so.  We will consider starting tamoxifen once she comes off her estrogen and stays in a stable condition.  Breast cancer surveillance: Mammograms were done in October 2024.  Breast MRIs will be done in April 2025.  Return to clinic in 1 year for follow-up.

## 2022-09-21 ENCOUNTER — Telehealth: Payer: Self-pay | Admitting: Hematology and Oncology

## 2022-09-21 NOTE — Radiation Completion Notes (Addendum)
  Radiation Oncology         [phone]) [phone] ________________________________  Name: Kelli Richmond MRN: [id number]  Date of Service: 09/17/2022  DOB: Oct 11, 1955  End of Treatment Note   Diagnosis: Low-grade, ER/PR positive DCIS of the left breast   Intent: Curative     ==========DELIVERED PLANS==========  First Treatment Date: 2022-08-03 - Last Treatment Date: 2022-09-16   Plan Name: Breast_L_BH Site: Breast, Left Technique: 3D Mode: Photon Dose Per Fraction: 2 Gy Prescribed Dose (Delivered / Prescribed): 50 Gy / 50 Gy Prescribed Fxs (Delivered / Prescribed): 25 / 25   Plan Name: Breast_L_Bst Site: Breast, Left Technique: Electron Mode: Electron Dose Per Fraction: 1.8 Gy Prescribed Dose (Delivered / Prescribed): 14.4 Gy / 14.4 Gy Prescribed Fxs (Delivered / Prescribed): 8 / 8     ==========ON TREATMENT VISIT DATES========== 2022-08-07, 2022-08-14, 2022-08-21, 2022-08-28, 2022-09-04, 2022-09-10, 2022-09-15    See weekly On Treatment Notes is Epic for details. The patient tolerated radiation. She developed fatigue and anticipated skin changes in the treatment field.   The patient will receive a call in about one month from the radiation oncology department. She will continue follow up with Dr. Pamelia Hoit as well.      Osker Mason, PAC

## 2022-09-21 NOTE — Telephone Encounter (Signed)
Scheduled appointment per 5/10 los. Left voicemail.  

## 2022-11-23 ENCOUNTER — Ambulatory Visit
Admission: RE | Admit: 2022-11-23 | Discharge: 2022-11-23 | Disposition: A | Discharge status: Home / Self Care | Payer: Medicare Other | Source: Ambulatory Visit | Attending: Hematology and Oncology | Admitting: Hematology and Oncology

## 2022-11-23 NOTE — Progress Notes (Signed)
  Radiation Oncology         [phone]) [phone] ________________________________  Name: Kelli Richmond MRN: [id number]  Date of Service: 11/23/2022  DOB: [birth year]  Post Treatment Telephone Note  Diagnosis:  Low-grade, ER/PR positive DCIS of the left breast (as documented in provider EOT note)   The patient was available for call today.   Symptoms of fatigue have improved since completing therapy.  Symptoms of skin changes have improved since completing therapy.  The patient was encouraged to avoid sun exposure in the area of prior treatment for up to one year following radiation with either sunscreen or by the style of clothing worn in the sun.  The patient has scheduled follow up with her medical oncologist Dr. Pamelia Hoit for ongoing surveillance, and was encouraged to call if she develops concerns or questions regarding radiation.   This concludes the interaction.  Ruel Favors, LPN

## 2023-02-17 ENCOUNTER — Encounter (HOSPITAL_BASED_OUTPATIENT_CLINIC_OR_DEPARTMENT_OTHER): Payer: Self-pay

## 2023-02-17 ENCOUNTER — Emergency Department (HOSPITAL_BASED_OUTPATIENT_CLINIC_OR_DEPARTMENT_OTHER)
Admission: EM | Admit: 2023-02-17 | Discharge: 2023-02-18 | Disposition: A | Discharge status: Home / Self Care | Payer: Medicare Other | Attending: Emergency Medicine | Admitting: Emergency Medicine

## 2023-02-17 ENCOUNTER — Other Ambulatory Visit: Payer: Self-pay

## 2023-02-17 DIAGNOSIS — R112 Nausea with vomiting, unspecified: Secondary | ICD-10-CM | POA: Diagnosis not present

## 2023-02-17 DIAGNOSIS — Z853 Personal history of malignant neoplasm of breast: Secondary | ICD-10-CM | POA: Insufficient documentation

## 2023-02-17 DIAGNOSIS — R519 Headache, unspecified: Secondary | ICD-10-CM | POA: Diagnosis present

## 2023-02-17 LAB — CBC
HCT: 39 % (ref 36.0–46.0)
Hemoglobin: 13.3 g/dL (ref 12.0–15.0)
MCH: 33.3 pg (ref 26.0–34.0)
MCHC: 34.1 g/dL (ref 30.0–36.0)
MCV: 97.5 fL (ref 80.0–100.0)
Platelets: 246 10*3/uL (ref 150–400)
RBC: 4 MIL/uL (ref 3.87–5.11)
RDW: 12.6 % (ref 11.5–15.5)
WBC: 7.6 10*3/uL (ref 4.0–10.5)
nRBC: 0 % (ref 0.0–0.2)

## 2023-02-17 LAB — COMPREHENSIVE METABOLIC PANEL
ALT: 9 U/L (ref 0–44)
AST: 15 U/L (ref 15–41)
Albumin: 4.2 g/dL (ref 3.5–5.0)
Alkaline Phosphatase: 70 U/L (ref 38–126)
Anion gap: 8 (ref 5–15)
BUN: 13 mg/dL (ref 8–23)
CO2: 20 mmol/L — ABNORMAL LOW (ref 22–32)
Calcium: 8.7 mg/dL — ABNORMAL LOW (ref 8.9–10.3)
Chloride: 108 mmol/L (ref 98–111)
Creatinine, Ser: 0.95 mg/dL (ref 0.44–1.00)
GFR, Estimated: >60 mL/min (ref >60)
Glucose, Bld: 100 mg/dL — ABNORMAL HIGH (ref 70–99)
Potassium: 3.8 mmol/L (ref 3.5–5.1)
Sodium: 136 mmol/L (ref 135–145)
Total Bilirubin: 0.4 mg/dL (ref 0.3–1.2)
Total Protein: 6.9 g/dL (ref 6.5–8.1)

## 2023-02-17 LAB — LIPASE, BLOOD: Lipase: 33 U/L (ref 11–51)

## 2023-02-17 MED ORDER — KETOROLAC TROMETHAMINE 15 MG/ML IJ SOLN
15.0000 mg | Freq: Once | INTRAMUSCULAR | Status: AC
  Administered 2023-02-17: 15 mg via INTRAVENOUS
  Filled 2023-02-17: qty 1

## 2023-02-17 MED ORDER — PROCHLORPERAZINE EDISYLATE 10 MG/2ML IJ SOLN
10.0000 mg | Freq: Once | INTRAMUSCULAR | Status: AC
  Administered 2023-02-17: 10 mg via INTRAVENOUS
  Filled 2023-02-17: qty 2

## 2023-02-17 NOTE — Discharge Instructions (Signed)
I am glad you are feeling better.  Please follow-up closely with your doctor for further care.

## 2023-02-17 NOTE — ED Triage Notes (Signed)
Patient arrives with complaints of vomiting that started today related to her migraine headaches. Patient states that she tried her home prescription with no relief.

## 2023-02-17 NOTE — ED Provider Notes (Signed)
Kelli [facility] EMERGENCY DEPARTMENT AT [facility] Provider Note   CSN: [id number] Arrival date & time: 02/17/23  1843     History  Chief Complaint  Patient presents with   Migraine   Emesis    Kelli Richmond is a 67 y.o. female.  The history is provided by the patient and medical records. No language interpreter was used.  Migraine  Emesis    67 year old female significant history of migraine headache, remote breast cancer, depression, presenting complaint of headache.  Patient endorse gradual onset of right-sided headache with associated nausea and some light sensitivity ongoing for the past 2 days.  Headache felt very similar to migraine headache that she has had in the past.  She thought her headache may be due to starting a new medication, Cymbalta which she felt Nauseous and she is unable to take her regular maintenance headache.  She denies any associated fever or chills no runny nose sneezing or coughing no chest pain or shortness of breath no focal numbness or focal weakness or rash.  She mentioned this headache is very similar to prior migraine headache.  Home Medications Prior to Admission medications   Medication Sig Start Date End Date Taking? Authorizing Provider  DULoxetine (CYMBALTA) 60 MG capsule Take 60 mg by mouth daily.      [provider]  estradiol (VIVELLE-DOT) 0.05 MG/24HR patch Place 1 patch onto the skin 2 (two) times a week.    [provider]  rizatriptan (MAXALT-MLT) 10 MG disintegrating tablet Take 10 mg by mouth as needed for migraine.  02/11/17   [provider]  rosuvastatin (CRESTOR) 10 MG tablet Take 10 mg by mouth at bedtime. 10/04/19   [provider]  topiramate (TOPAMAX) 100 MG tablet Take 250 mg by mouth daily.  04/12/16   [provider]      Allergies    Patient has no known allergies.    Review of Systems   Review of Systems  Gastrointestinal:  Positive for vomiting.  All other  systems reviewed and are negative.   Physical Exam Updated Vital Signs BP (!) 141/88 (BP Location: Left Arm)   Pulse (!) 101   Temp 97.8 F (36.6 C) (Temporal)   Resp 20   Ht 5\' 2"  (1.575 m)   Wt 63 kg   SpO2 100%   BMI 25.40 kg/m  Physical Exam Vitals and nursing note reviewed.  Constitutional:      General: She is not in acute distress.    Appearance: She is well-developed.  HENT:     Head: Normocephalic and atraumatic.  Eyes:     Extraocular Movements: Extraocular movements intact.     Conjunctiva/sclera: Conjunctivae normal.     Pupils: Pupils are equal, round, and reactive to light.  Cardiovascular:     Rate and Rhythm: Normal rate and regular rhythm.  Pulmonary:     Effort: Pulmonary effort is normal.  Abdominal:     Palpations: Abdomen is soft.  Musculoskeletal:     Cervical back: Normal range of motion and neck supple. No rigidity or tenderness.  Skin:    Findings: No rash.  Neurological:     Mental Status: She is alert and oriented to person, place, and time.     GCS: GCS eye subscore is 4. GCS verbal subscore is 5. GCS motor subscore is 6.     Cranial Nerves: Cranial nerves 2-12 are intact.     Sensory: Sensation is intact.     Motor:  Motor function is intact.     Coordination: Coordination is intact.  Psychiatric:        Mood and Affect: Mood normal.     ED Results / Procedures / Treatments   Labs (all labs ordered are listed, but only abnormal results are displayed) Labs Reviewed  COMPREHENSIVE METABOLIC PANEL - Abnormal; Notable for the following components:      Result Value   CO2 20 (*)    Glucose, Bld 100 (*)    Calcium 8.7 (*)    All other components within normal limits  LIPASE, BLOOD  CBC    EKG None  Date: 02/17/2023  Rate: 85  Rhythm: normal sinus rhythm  QRS Axis: normal  Intervals: normal  ST/T Wave abnormalities: normal  Conduction Disutrbances: none  Narrative Interpretation:   Old EKG Reviewed: No significant changes  noted    Radiology No results found.  Procedures Procedures    Medications Ordered in ED Medications  ketorolac (TORADOL) 15 MG/ML injection 15 mg (15 mg Intravenous Given 02/17/23 2247)  prochlorperazine (COMPAZINE) injection 10 mg (10 mg Intravenous Given 02/17/23 2247)    ED Course/ Medical Decision Making/ A&P                                 Medical Decision Making Amount and/or Complexity of Data Reviewed Labs: ordered.  Risk Prescription drug management.   BP (!) 141/88 (BP Location: Left Arm)   Pulse (!) 101   Temp 97.8 F (36.6 C) (Temporal)   Resp 20   Ht 5\' 2"  (1.575 m)   Wt 63 kg   SpO2 100%   BMI 25.40 kg/m   47:45 PM   67 year old female significant history of migraine headache, remote breast cancer, depression, presenting complaint of headache.  Patient endorse gradual onset of right-sided headache with associated nausea and some light sensitivity ongoing for the past 2 days.  Headache felt very similar to migraine headache that she has had in the past.  She thought her headache may be due to starting a new medication, Cymbalta which she felt Nauseous and she is unable to take her regular maintenance headache.  She denies any associated fever or chills no runny nose sneezing or coughing no chest pain or shortness of breath no focal numbness or focal weakness or rash.  She mentioned this headache is very similar to prior migraine headache.  On exam this is a well-appearing elderly female resting comfortably in bed appears to be in no acute discomfort.  Heart with normal rate and rhythm, lungs clear to auscultation bilaterally abdomen is soft nontender 5 out of 5 strength all 4 extremities patient is alert and oriented x 4 she does not have any cranial nerve deficit.  Headaches likely regular migraine headache.  Low suspicion for subarachnoid hemorrhage as headache is not thunderclap headache all acute in onset, low suspicion for stroke or space-occupying  lesion as patient without any focal neurodeficit and I have low suspicion for meningitis as patient does not have any fever or nuchal rigidity.  I have considered LP and head CT scan but with low suspicion, it was not performed and patient agrees.  Labs obtained independent viewed interpreted by me and overall reassuring will provide supportive care including migraine cocktail.  Care discussed with DR. Pickering.   11:45 PM On reassessment after patient received migraine cocktail she reported much improvement of her headache.  She was able to  sleep.  She tolerates p.o.  She feels comfortable going home.  She understands return if symptoms worsen otherwise follow-up with her PCP.  Social determinant of health including inadequate physical activity.  Encourage patient to resume taking her headache medication.        Final Clinical Impression(s) / ED Diagnoses Final diagnoses:  Bad headache    Rx / DC Orders ED Discharge Orders     None         Fayrene Helper, PA-C 02/17/23 2347    Benjiman Core, MD 02/18/23 2350

## 2023-03-04 ENCOUNTER — Ambulatory Visit
Admission: RE | Admit: 2023-03-04 | Discharge: 2023-03-04 | Disposition: A | Discharge status: Home / Self Care | Payer: Medicare Other | Source: Ambulatory Visit | Attending: Hematology and Oncology | Admitting: Hematology and Oncology

## 2023-03-04 DIAGNOSIS — D0512 Intraductal carcinoma in situ of left breast: Secondary | ICD-10-CM

## 2023-05-24 ENCOUNTER — Other Ambulatory Visit (HOSPITAL_COMMUNITY): Payer: Self-pay | Admitting: Emergency Medicine

## 2023-05-24 DIAGNOSIS — T8544XA Capsular contracture of breast implant, initial encounter: Secondary | ICD-10-CM

## 2023-05-29 ENCOUNTER — Ambulatory Visit (HOSPITAL_COMMUNITY): Admission: RE | Admit: 2023-05-29 | Payer: PPO | Source: Ambulatory Visit

## 2023-06-01 ENCOUNTER — Ambulatory Visit (HOSPITAL_COMMUNITY)
Admission: RE | Admit: 2023-06-01 | Discharge: 2023-06-01 | Disposition: A | Discharge status: Home / Self Care | Payer: PPO | Source: Ambulatory Visit | Attending: Emergency Medicine | Admitting: Emergency Medicine

## 2023-06-01 DIAGNOSIS — T8544XA Capsular contracture of breast implant, initial encounter: Secondary | ICD-10-CM | POA: Insufficient documentation

## 2023-06-01 MED ORDER — GADOBUTROL 1 MMOL/ML IV SOLN
6.0000 mL | Freq: Once | INTRAVENOUS | Status: AC | PRN
  Administered 2023-06-01: 6 mL via INTRAVENOUS

## 2023-09-20 ENCOUNTER — Inpatient Hospital Stay: Payer: Medicare Other | Attending: Hematology and Oncology | Admitting: Hematology and Oncology

## 2023-09-20 VITALS — BP 138/76 | HR 71 | Temp 97.9°F | Resp 18 | Ht 62.0 in | Wt 145.8 lb

## 2023-09-20 DIAGNOSIS — Z79899 Other long term (current) drug therapy: Secondary | ICD-10-CM | POA: Insufficient documentation

## 2023-09-20 DIAGNOSIS — Z85820 Personal history of malignant melanoma of skin: Secondary | ICD-10-CM | POA: Diagnosis not present

## 2023-09-20 DIAGNOSIS — G43909 Migraine, unspecified, not intractable, without status migrainosus: Secondary | ICD-10-CM | POA: Insufficient documentation

## 2023-09-20 DIAGNOSIS — E785 Hyperlipidemia, unspecified: Secondary | ICD-10-CM | POA: Insufficient documentation

## 2023-09-20 DIAGNOSIS — Z923 Personal history of irradiation: Secondary | ICD-10-CM | POA: Insufficient documentation

## 2023-09-20 DIAGNOSIS — D0512 Intraductal carcinoma in situ of left breast: Secondary | ICD-10-CM | POA: Diagnosis present

## 2023-09-20 MED ORDER — ROSUVASTATIN CALCIUM 20 MG PO TABS: 20.0000 mg | ORAL_TABLET | Freq: Every day | ORAL | Status: AC

## 2023-09-20 MED ORDER — ESCITALOPRAM OXALATE 5 MG PO TABS: 5.0000 mg | ORAL_TABLET | Freq: Every day | ORAL | Status: AC

## 2023-09-20 MED ORDER — OXYBUTYNIN CHLORIDE ER 5 MG PO TB24: 5.0000 mg | ORAL_TABLET | Freq: Every day | ORAL | Status: AC

## 2023-09-20 MED ORDER — EMGALITY 120 MG/ML ~~LOC~~ SOAJ: 1.0000 | SUBCUTANEOUS | Status: AC

## 2023-09-20 MED ORDER — BUPROPION HCL ER (XL) 150 MG PO TB24: 150.0000 mg | ORAL_TABLET | Freq: Every day | ORAL | Status: AC

## 2023-09-20 MED ORDER — MONTELUKAST SODIUM 10 MG PO TABS: 10.0000 mg | ORAL_TABLET | Freq: Every day | ORAL | Status: AC

## 2023-09-20 NOTE — Assessment & Plan Note (Signed)
 05/08/2022: Mild interval enlargement of previously biopsied left breast mass 1.1 cm, biopsy: Intraductal papilloma 06/23/2022 left lumpectomy 06/23/2022: Low-grade DCIS involving intraductal papilloma negative for invasive cancer 7.5 mm ER 90%, PR 100% 09/16/2022: Completed adjuvant radiation   Treatment plan: Antiestrogen therapy with tamoxifen x 5 years.  Recommended 10 mg daily. Tamoxifen counseling: We discussed the risks and benefits of tamoxifen.  She was not able to stop her estrogen because it caused severe headaches.  Therefore she is not ready to start tamoxifen.   She will consider slowly tapering tamoxifen over the next year or so.  We will consider starting tamoxifen once she comes off her estrogen and stays in a stable condition.   Breast cancer surveillance:  Mammogram 03/04/2023: Benign breast density category B Breast MRI 06/02/2023: Benign breast density category B Breast exam 09/20/2023: Benign   Return to clinic in 1 year for follow-up.

## 2023-09-20 NOTE — Progress Notes (Signed)
 Patient Care Team: Davis Esters as PCP - General (Physician Assistant) Alane Hsu, RN as Oncology Nurse Navigator Auther Bo, RN as Oncology Nurse Navigator Sim Dryer, MD as Consulting Physician (General Surgery) Cameron Cea, MD as Consulting Physician (Hematology and Oncology) Johna Myers, MD as Consulting Physician (Radiation Oncology)  DIAGNOSIS:  Encounter Diagnosis  Name Primary?   Ductal carcinoma in situ (DCIS) of left breast Yes      CHIEF COMPLIANT: Surveillance of DCIS left breast  HISTORY OF PRESENT ILLNESS:   History of Present Illness Kelli Richmond is a 68 year old female with ductal carcinoma in situ and malignant melanoma who presents for follow-up on her breast reconstruction and melanoma treatment.  She discontinued tamoxifen on March 4th after a year of use. Five weeks ago, she underwent reconstructive breast surgery at [facility] due to deformation of her implants from radiation. The right breast is healing well, but the left breast remains sensitive. She started a new medication last Thursday to soften the tissue, as the implant is positioned higher than desired. A sleeve was placed to prevent rejection of the implant.  She has a history of multiple malignant melanomas, with numerous surgeries on her face. Recently, she had a malignant melanoma excised from her foot last Thursday, which had recurred. The initial diagnosis was squamous cell carcinoma, but it was confirmed as malignant melanoma. She is concerned about the frequency of her cancer diagnoses.     ALLERGIES:  has no known allergies.  MEDICATIONS:  Current Outpatient Medications  Medication Sig Dispense Refill   DULoxetine  (CYMBALTA ) 60 MG capsule Take 60 mg by mouth daily.       estradiol (VIVELLE-DOT) 0.05 MG/24HR patch Place 1 patch onto the skin 2 (two) times a week.     rizatriptan (MAXALT-MLT) 10 MG disintegrating tablet Take 10 mg by mouth as  needed for migraine.      rosuvastatin  (CRESTOR ) 10 MG tablet Take 10 mg by mouth at bedtime.     topiramate  (TOPAMAX ) 100 MG tablet Take 250 mg by mouth daily.      No current facility-administered medications for this visit.    PHYSICAL EXAMINATION: ECOG PERFORMANCE STATUS: 1 - Symptomatic but completely ambulatory  Vitals:   09/20/23 1126  BP: 138/76  Pulse: 71  Resp: 18  Temp: 97.9 F (36.6 C)  SpO2: 92%   Filed Weights   09/20/23 1126  Weight: 145 lb 12.8 oz (66.1 kg)    Physical Exam   (exam performed in the presence of a chaperone)  LABORATORY DATA:  I have reviewed the data as listed    Latest Ref Rng & Units 02/17/2023    7:03 PM 10/18/2019    2:28 AM 10/17/2019    4:31 AM  CMP  Glucose 70 - 99 mg/dL 454  098  119   BUN 8 - 23 mg/dL 13  11  11    Creatinine 0.44 - 1.00 mg/dL 1.47  8.29  5.62   Sodium 135 - 145 mmol/L 136  139  142   Potassium 3.5 - 5.1 mmol/L 3.8  3.5  3.8   Chloride 98 - 111 mmol/L 108  111  111   CO2 22 - 32 mmol/L 20  20  21    Calcium  8.9 - 10.3 mg/dL 8.7  7.8  8.1   Total Protein 6.5 - 8.1 g/dL 6.9  5.3  5.5   Total Bilirubin 0.3 - 1.2 mg/dL 0.4  0.7  0.3  Alkaline Phos 38 - 126 U/L 70  99  88   AST 15 - 41 U/L 15  99  61   ALT 0 - 44 U/L 9  66  30     Lab Results  Component Value Date   WBC 7.6 02/17/2023   HGB 13.3 02/17/2023   HCT 39.0 02/17/2023   MCV 97.5 02/17/2023   PLT 246 02/17/2023   NEUTROABS 5.1 10/18/2019    ASSESSMENT & PLAN:  Ductal carcinoma in situ (DCIS) of left breast 05/08/2022: Mild interval enlargement of previously biopsied left breast mass 1.1 cm, biopsy: Intraductal papilloma 06/23/2022 left lumpectomy 06/23/2022: Low-grade DCIS involving intraductal papilloma negative for invasive cancer 7.5 mm ER 90%, PR 100% 09/16/2022: Completed adjuvant radiation   Treatment plan: She was able to taper off and discontinue the estrogen patch.  Because of her multiple other small health issues she does not want to take  any antiestrogen therapy.  We discussed tamoxifen and anastrozole.   Breast cancer surveillance:  Mammogram 03/04/2023: Benign breast density category B Breast MRI 06/02/2023: Benign breast density category B Breast exam 09/20/2023: Benign   Return to clinic in 1 year for follow-up. ------------------------------------- Assessment and Plan Assessment & Plan Malignant melanoma Recurrent malignant melanoma excised from the foot. History of multiple surgeries for melanoma on the face. Recent excision believed to have removed all malignant tissue. - Follow up with dermatologist as needed.  Ductal carcinoma in situ (DCIS) of left breast DCIS with recent reconstructive surgery due to radiation-induced implant deformation. Discussed antiestrogen therapy options, decided against due to side effects and current health status. Monitoring without medication as recurrence can be managed surgically. - Continue regular mammograms. - Follow up with GYN surgeon and primary care for ongoing management.  Hyperlipidemia Managed with rosuvastatin  20 mg at bedtime. Recent dosage increase due to elevated LDL levels.  Migraine Chronic migraines with recent initiation of monthly Emgality injections. Recent severe episode with nausea and vomiting. Evaluating Emgality's effectiveness. - Continue Emgality injections monthly.      No orders of the defined types were placed in this encounter.  The patient has a good understanding of the overall plan. she agrees with it. she will call with any problems that may develop before the next visit here. Total time spent: 30 mins including face to face time and time spent for planning, charting and co-ordination of care   Viinay K Domonique Cothran, MD 09/20/23
